# Patient Record
Sex: Female | Born: 1960 | Race: White | Hispanic: No | Smoking: Current every day smoker
Health system: Southern US, Community
[De-identification: ages and names within clinical notes are randomized; demographics above are authoritative.]

## PROBLEM LIST (undated history)

## (undated) HISTORY — PX: ABDOMINAL HYSTERECTOMY: SHX81

## (undated) HISTORY — PX: CHOLECYSTECTOMY: SHX55

---

## 2005-04-13 ENCOUNTER — Encounter: Admission: RE | Admit: 2005-04-13 | Discharge: 2005-04-13 | Payer: Self-pay | Admitting: Sports Medicine

## 2007-05-16 ENCOUNTER — Ambulatory Visit: Payer: Self-pay | Admitting: Nurse Practitioner

## 2007-05-16 DIAGNOSIS — F17200 Nicotine dependence, unspecified, uncomplicated: Secondary | ICD-10-CM

## 2007-05-16 DIAGNOSIS — K219 Gastro-esophageal reflux disease without esophagitis: Secondary | ICD-10-CM

## 2007-05-16 DIAGNOSIS — M79609 Pain in unspecified limb: Secondary | ICD-10-CM | POA: Insufficient documentation

## 2007-05-16 DIAGNOSIS — J309 Allergic rhinitis, unspecified: Secondary | ICD-10-CM | POA: Insufficient documentation

## 2007-05-28 ENCOUNTER — Telehealth (INDEPENDENT_AMBULATORY_CARE_PROVIDER_SITE_OTHER): Payer: Self-pay | Admitting: *Deleted

## 2007-05-30 ENCOUNTER — Ambulatory Visit (HOSPITAL_COMMUNITY): Admission: RE | Admit: 2007-05-30 | Discharge: 2007-05-30 | Payer: Self-pay | Admitting: Internal Medicine

## 2007-05-30 ENCOUNTER — Ambulatory Visit: Payer: Self-pay | Admitting: Nurse Practitioner

## 2007-05-30 DIAGNOSIS — H9319 Tinnitus, unspecified ear: Secondary | ICD-10-CM | POA: Insufficient documentation

## 2007-06-02 ENCOUNTER — Telehealth (INDEPENDENT_AMBULATORY_CARE_PROVIDER_SITE_OTHER): Payer: Self-pay | Admitting: Nurse Practitioner

## 2007-06-03 ENCOUNTER — Ambulatory Visit: Payer: Self-pay | Admitting: *Deleted

## 2007-06-03 ENCOUNTER — Ambulatory Visit: Payer: Self-pay | Admitting: Nurse Practitioner

## 2007-06-07 ENCOUNTER — Emergency Department (HOSPITAL_COMMUNITY): Admission: EM | Admit: 2007-06-07 | Discharge: 2007-06-07 | Payer: Self-pay | Admitting: Emergency Medicine

## 2007-06-19 ENCOUNTER — Ambulatory Visit: Payer: Self-pay | Admitting: Nurse Practitioner

## 2007-06-19 DIAGNOSIS — N951 Menopausal and female climacteric states: Secondary | ICD-10-CM

## 2007-06-19 DIAGNOSIS — K029 Dental caries, unspecified: Secondary | ICD-10-CM | POA: Insufficient documentation

## 2007-06-19 DIAGNOSIS — L03039 Cellulitis of unspecified toe: Secondary | ICD-10-CM

## 2007-06-19 LAB — CONVERTED CEMR LAB: Pap Smear: NORMAL

## 2007-06-20 ENCOUNTER — Encounter (INDEPENDENT_AMBULATORY_CARE_PROVIDER_SITE_OTHER): Payer: Self-pay | Admitting: Nurse Practitioner

## 2007-09-22 ENCOUNTER — Encounter (INDEPENDENT_AMBULATORY_CARE_PROVIDER_SITE_OTHER): Payer: Self-pay | Admitting: Nurse Practitioner

## 2007-09-25 ENCOUNTER — Emergency Department (HOSPITAL_COMMUNITY): Admission: EM | Admit: 2007-09-25 | Discharge: 2007-09-25 | Payer: Self-pay | Admitting: Emergency Medicine

## 2007-10-23 ENCOUNTER — Ambulatory Visit: Payer: Self-pay | Admitting: Nurse Practitioner

## 2007-10-23 DIAGNOSIS — I10 Essential (primary) hypertension: Secondary | ICD-10-CM

## 2007-10-27 ENCOUNTER — Telehealth (INDEPENDENT_AMBULATORY_CARE_PROVIDER_SITE_OTHER): Payer: Self-pay | Admitting: *Deleted

## 2008-03-29 ENCOUNTER — Telehealth (INDEPENDENT_AMBULATORY_CARE_PROVIDER_SITE_OTHER): Payer: Self-pay | Admitting: Nurse Practitioner

## 2008-04-06 ENCOUNTER — Encounter (INDEPENDENT_AMBULATORY_CARE_PROVIDER_SITE_OTHER): Payer: Self-pay | Admitting: *Deleted

## 2008-05-20 ENCOUNTER — Telehealth (INDEPENDENT_AMBULATORY_CARE_PROVIDER_SITE_OTHER): Payer: Self-pay | Admitting: Nurse Practitioner

## 2008-06-02 ENCOUNTER — Ambulatory Visit: Payer: Self-pay | Admitting: Nurse Practitioner

## 2008-06-02 LAB — CONVERTED CEMR LAB
Glucose, Urine, Semiquant: NEGATIVE
KOH Prep: NEGATIVE
Ketones, urine, test strip: NEGATIVE
Nitrite: NEGATIVE
Protein, U semiquant: NEGATIVE
Specific Gravity, Urine: 1.01

## 2008-06-11 ENCOUNTER — Ambulatory Visit: Payer: Self-pay | Admitting: Nurse Practitioner

## 2008-06-11 DIAGNOSIS — N61 Mastitis without abscess: Secondary | ICD-10-CM

## 2008-06-11 LAB — CONVERTED CEMR LAB
Blood in Urine, dipstick: NEGATIVE
KOH Prep: NEGATIVE
Nitrite: NEGATIVE
Protein, U semiquant: NEGATIVE
Urobilinogen, UA: 0.2
WBC Urine, dipstick: NEGATIVE
pH: 7.5

## 2008-09-10 ENCOUNTER — Ambulatory Visit: Payer: Self-pay | Admitting: Nurse Practitioner

## 2008-09-10 ENCOUNTER — Encounter (INDEPENDENT_AMBULATORY_CARE_PROVIDER_SITE_OTHER): Payer: Self-pay | Admitting: Nurse Practitioner

## 2008-09-10 DIAGNOSIS — A599 Trichomoniasis, unspecified: Secondary | ICD-10-CM

## 2008-09-10 DIAGNOSIS — E669 Obesity, unspecified: Secondary | ICD-10-CM

## 2008-09-14 ENCOUNTER — Encounter (INDEPENDENT_AMBULATORY_CARE_PROVIDER_SITE_OTHER): Payer: Self-pay | Admitting: Nurse Practitioner

## 2008-09-14 LAB — CONVERTED CEMR LAB
HCV Ab: NEGATIVE
Hep A Total Ab: NEGATIVE
Hep B S Ab: NEGATIVE
Lipase: 27 units/L (ref 0–75)

## 2008-09-16 ENCOUNTER — Ambulatory Visit (HOSPITAL_COMMUNITY): Admission: RE | Admit: 2008-09-16 | Discharge: 2008-09-16 | Payer: Self-pay | Admitting: Internal Medicine

## 2008-09-20 ENCOUNTER — Telehealth (INDEPENDENT_AMBULATORY_CARE_PROVIDER_SITE_OTHER): Payer: Self-pay | Admitting: Nurse Practitioner

## 2009-01-26 ENCOUNTER — Emergency Department (HOSPITAL_COMMUNITY): Admission: EM | Admit: 2009-01-26 | Discharge: 2009-01-26 | Payer: Self-pay | Admitting: Obstetrics and Gynecology

## 2010-01-07 ENCOUNTER — Emergency Department (HOSPITAL_COMMUNITY): Admission: EM | Admit: 2010-01-07 | Discharge: 2010-01-07 | Payer: Self-pay | Admitting: Emergency Medicine

## 2010-01-09 ENCOUNTER — Emergency Department (HOSPITAL_COMMUNITY): Admission: EM | Admit: 2010-01-09 | Discharge: 2010-01-09 | Payer: Self-pay | Admitting: Emergency Medicine

## 2010-04-14 ENCOUNTER — Emergency Department (HOSPITAL_COMMUNITY): Admission: EM | Admit: 2010-04-14 | Discharge: 2010-04-14 | Payer: Self-pay | Admitting: Emergency Medicine

## 2010-07-06 ENCOUNTER — Emergency Department (HOSPITAL_COMMUNITY)
Admission: EM | Admit: 2010-07-06 | Discharge: 2010-07-06 | Payer: Self-pay | Source: Home / Self Care | Admitting: Emergency Medicine

## 2010-08-20 ENCOUNTER — Encounter: Payer: Self-pay | Admitting: Family Medicine

## 2010-08-20 ENCOUNTER — Encounter: Payer: Self-pay | Admitting: General Surgery

## 2010-08-27 LAB — CONVERTED CEMR LAB
AST: 38 units/L — ABNORMAL HIGH (ref 0–37)
Albumin: 4.3 g/dL (ref 3.5–5.2)
Alkaline Phosphatase: 65 units/L (ref 39–117)
BUN: 12 mg/dL (ref 6–23)
Basophils Absolute: 0 10*3/uL (ref 0.0–0.1)
Basophils Relative: 0 % (ref 0–1)
Basophils Relative: 1 % (ref 0–1)
Bilirubin Urine: NEGATIVE
CO2: 21 meq/L (ref 19–32)
CO2: 23 meq/L (ref 19–32)
Chlamydia, DNA Probe: NEGATIVE
Chlamydia, DNA Probe: NEGATIVE
Chloride: 106 meq/L (ref 96–112)
Chloride: 107 meq/L (ref 96–112)
Cholesterol: 178 mg/dL (ref 0–200)
Cholesterol: 203 mg/dL — ABNORMAL HIGH (ref 0–200)
Creatinine, Ser: 0.72 mg/dL (ref 0.40–1.20)
Creatinine, Ser: 0.76 mg/dL (ref 0.40–1.20)
Eosinophils Relative: 2 % (ref 0–5)
FSH: 2.2 milliintl units/mL
Glucose, Bld: 87 mg/dL (ref 70–99)
Glucose, Bld: 96 mg/dL (ref 70–99)
Glucose, Urine, Semiquant: NEGATIVE
Glucose, Urine, Semiquant: NEGATIVE
HDL: 63 mg/dL (ref >39)
Hemoglobin: 14.4 g/dL (ref 12.0–15.0)
Hemoglobin: 15.4 g/dL — ABNORMAL HIGH (ref 12.0–15.0)
KOH Prep: NEGATIVE
KOH Prep: NEGATIVE
Ketones, urine, test strip: NEGATIVE
LDL Cholesterol: 92 mg/dL (ref 0–99)
Lymphocytes Relative: 36 % (ref 12–46)
Lymphs Abs: 3 10*3/uL (ref 0.7–4.0)
MCHC: 33.5 g/dL (ref 30.0–36.0)
Monocytes Absolute: 0.5 10*3/uL (ref 0.1–1.0)
Monocytes Absolute: 0.5 10*3/uL (ref 0.1–1.0)
Monocytes Relative: 7 % (ref 3–12)
Neutro Abs: 3.5 10*3/uL (ref 1.7–7.7)
Neutrophils Relative %: 49 % (ref 43–77)
Platelets: 255 10*3/uL (ref 150–400)
Potassium: 4.1 meq/L (ref 3.5–5.3)
Protein, U semiquant: NEGATIVE
Protein, U semiquant: NEGATIVE
RBC: 4.53 M/uL (ref 3.87–5.11)
Sodium: 141 meq/L (ref 135–145)
Specific Gravity, Urine: 1.015
Specific Gravity, Urine: >=1.03
TSH: 0.918 microintl units/mL (ref 0.350–4.50)
Total Bilirubin: 0.5 mg/dL (ref 0.3–1.2)
Triglycerides: 103 mg/dL (ref <150)
Urobilinogen, UA: 0.2
VLDL: 21 mg/dL (ref 0–40)
WBC Urine, dipstick: NEGATIVE
pH: 5

## 2010-11-15 ENCOUNTER — Inpatient Hospital Stay (INDEPENDENT_AMBULATORY_CARE_PROVIDER_SITE_OTHER)
Admission: RE | Admit: 2010-11-15 | Discharge: 2010-11-15 | Disposition: A | Discharge status: Home / Self Care | Payer: Self-pay | Source: Ambulatory Visit | Attending: Family Medicine | Admitting: Family Medicine

## 2010-11-15 DIAGNOSIS — R109 Unspecified abdominal pain: Secondary | ICD-10-CM

## 2010-11-15 LAB — POCT I-STAT, CHEM 8
Glucose, Bld: 112 mg/dL — ABNORMAL HIGH (ref 70–99)
Potassium: 4.7 mEq/L (ref 3.5–5.1)
Sodium: 144 mEq/L (ref 135–145)
TCO2: 28 mmol/L (ref 0–100)

## 2010-11-15 LAB — OCCULT BLOOD, POC DEVICE: Fecal Occult Bld: NEGATIVE

## 2010-11-26 ENCOUNTER — Emergency Department (HOSPITAL_COMMUNITY)
Admission: EM | Admit: 2010-11-26 | Discharge: 2010-11-26 | Disposition: A | Discharge status: Home / Self Care | Payer: Self-pay | Attending: Emergency Medicine | Admitting: Emergency Medicine

## 2010-11-26 ENCOUNTER — Inpatient Hospital Stay (INDEPENDENT_AMBULATORY_CARE_PROVIDER_SITE_OTHER)
Admission: RE | Admit: 2010-11-26 | Discharge: 2010-11-26 | Disposition: A | Discharge status: Home / Self Care | Payer: Self-pay | Source: Ambulatory Visit | Attending: Family Medicine | Admitting: Family Medicine

## 2010-11-26 ENCOUNTER — Emergency Department (HOSPITAL_COMMUNITY): Payer: Self-pay

## 2010-11-26 DIAGNOSIS — R1013 Epigastric pain: Secondary | ICD-10-CM

## 2010-11-26 DIAGNOSIS — R112 Nausea with vomiting, unspecified: Secondary | ICD-10-CM | POA: Insufficient documentation

## 2010-11-26 DIAGNOSIS — R109 Unspecified abdominal pain: Secondary | ICD-10-CM | POA: Insufficient documentation

## 2010-11-26 LAB — COMPREHENSIVE METABOLIC PANEL
ALT: 29 U/L (ref 0–35)
AST: 30 U/L (ref 0–37)
Albumin: 4 g/dL (ref 3.5–5.2)
BUN: 16 mg/dL (ref 6–23)
Calcium: 9.5 mg/dL (ref 8.4–10.5)
Creatinine, Ser: 0.85 mg/dL (ref 0.4–1.2)
Total Protein: 7.1 g/dL (ref 6.0–8.3)

## 2010-11-26 LAB — CBC
HCT: 43.6 % (ref 36.0–46.0)
MCH: 33 pg (ref 26.0–34.0)
MCHC: 34.6 g/dL (ref 30.0–36.0)
MCV: 95.2 fL (ref 78.0–100.0)
Platelets: 254 10*3/uL (ref 150–400)
RBC: 4.58 MIL/uL (ref 3.87–5.11)
RDW: 13.5 % (ref 11.5–15.5)
WBC: 8.7 10*3/uL (ref 4.0–10.5)

## 2010-11-26 LAB — AMYLASE: Amylase: 53 U/L (ref 0–105)

## 2011-01-16 ENCOUNTER — Inpatient Hospital Stay (INDEPENDENT_AMBULATORY_CARE_PROVIDER_SITE_OTHER)
Admission: RE | Admit: 2011-01-16 | Discharge: 2011-01-16 | Disposition: A | Discharge status: Home / Self Care | Payer: Self-pay | Source: Ambulatory Visit | Attending: Family Medicine | Admitting: Family Medicine

## 2011-01-16 DIAGNOSIS — J019 Acute sinusitis, unspecified: Secondary | ICD-10-CM

## 2011-04-20 LAB — I-STAT 8, (EC8 V) (CONVERTED LAB)
Bicarbonate: 25 — ABNORMAL HIGH
HCT: 51 — ABNORMAL HIGH
TCO2: 26
pCO2, Ven: 39.1 — ABNORMAL LOW
pH, Ven: 7.413 — ABNORMAL HIGH

## 2011-04-20 LAB — URINALYSIS, ROUTINE W REFLEX MICROSCOPIC
Nitrite: NEGATIVE
Protein, ur: NEGATIVE

## 2011-04-20 LAB — URINE MICROSCOPIC-ADD ON

## 2011-04-20 LAB — PREGNANCY, URINE: Preg Test, Ur: NEGATIVE

## 2011-04-20 LAB — POCT I-STAT CREATININE: Creatinine, Ser: 0.8

## 2011-05-08 LAB — CBC
HCT: 43.9
Hemoglobin: 15
MCHC: 34.3
MCV: 96.2
Platelets: 317
RBC: 4.56
RDW: 13.4
WBC: 14.4 — ABNORMAL HIGH

## 2011-05-08 LAB — I-STAT 8, (EC8 V) (CONVERTED LAB)
Acid-Base Excess: 5 — ABNORMAL HIGH
Chloride: 101
pCO2, Ven: 43.2 — ABNORMAL LOW
pH, Ven: 7.442 — ABNORMAL HIGH

## 2011-05-08 LAB — DIFFERENTIAL
Basophils Absolute: 0.1
Basophils Relative: 0
Eosinophils Absolute: 0.1
Eosinophils Relative: 1
Lymphocytes Relative: 22
Lymphs Abs: 3.2
Monocytes Absolute: 0.9 — ABNORMAL HIGH
Monocytes Relative: 6
Neutro Abs: 10.1 — ABNORMAL HIGH
Neutrophils Relative %: 70

## 2011-05-08 LAB — POCT I-STAT CREATININE
Creatinine, Ser: 0.8
Operator id: 234501

## 2012-03-06 ENCOUNTER — Encounter (HOSPITAL_BASED_OUTPATIENT_CLINIC_OR_DEPARTMENT_OTHER): Payer: Self-pay | Admitting: *Deleted

## 2012-03-06 ENCOUNTER — Emergency Department (HOSPITAL_BASED_OUTPATIENT_CLINIC_OR_DEPARTMENT_OTHER)
Admission: EM | Admit: 2012-03-06 | Discharge: 2012-03-06 | Disposition: A | Discharge status: Home / Self Care | Payer: Self-pay | Attending: Emergency Medicine | Admitting: Emergency Medicine

## 2012-03-06 DIAGNOSIS — K644 Residual hemorrhoidal skin tags: Secondary | ICD-10-CM | POA: Insufficient documentation

## 2012-03-06 DIAGNOSIS — J3489 Other specified disorders of nose and nasal sinuses: Secondary | ICD-10-CM | POA: Insufficient documentation

## 2012-03-06 DIAGNOSIS — J329 Chronic sinusitis, unspecified: Secondary | ICD-10-CM

## 2012-03-06 DIAGNOSIS — K649 Unspecified hemorrhoids: Secondary | ICD-10-CM

## 2012-03-06 DIAGNOSIS — F17200 Nicotine dependence, unspecified, uncomplicated: Secondary | ICD-10-CM | POA: Insufficient documentation

## 2012-03-06 MED ORDER — CHLORPHEN-PSEUDOEPHED-APAP 2-30-500 MG PO TABS: 1.0000 | ORAL_TABLET | ORAL | Status: AC | PRN

## 2012-03-06 MED ORDER — HYDROCORTISONE 2.5 % RE CREA: TOPICAL_CREAM | RECTAL | Status: AC

## 2012-03-06 MED ORDER — OXYMETAZOLINE HCL 0.05 % NA SOLN
1.0000 | Freq: Once | NASAL | Status: AC
  Administered 2012-03-06: 1 via NASAL
  Filled 2012-03-06: qty 15

## 2012-03-06 NOTE — ED Notes (Signed)
Patient reports that she has "sinus pressure" and possible infection.  Patient reports that she is at [facility] and not allowed to take anything OTC.  She also requested that we check her hemorrhoids while she is here.

## 2012-03-06 NOTE — ED Provider Notes (Signed)
History     CSN: [id number]  Arrival date & time 03/06/12  0915   First MD Initiated Contact with Patient 03/06/12 [phone]      Chief Complaint  Patient presents with  . Hemorrhoids  . Facial Pain    (Consider location/radiation/quality/duration/timing/severity/associated sxs/prior treatment) Patient is a 51 y.o. female presenting with URI. The history is provided by the patient.  URI The primary symptoms include ear pain. Primary symptoms do not include fever, cough or nausea. Primary symptoms comment: Sinus congestion Episode onset: 8 days ago she's been unable to take her medication. This is a recurrent problem. The problem has been gradually worsening.  The ear pain began more than 2 days ago. Ear pain is a new problem. The ear pain has been gradually worsening since its onset. Both ears are affected. The pain is moderate.    Symptoms associated with the illness include plugged ear sensation, sinus pressure and congestion. The illness is not associated with facial pain. The following treatments were addressed: Acetaminophen was not tried. A decongestant was not tried.    History reviewed. No pertinent past medical history.  Past Surgical History  Procedure Date  . Abdominal hysterectomy   . Cholecystectomy     History reviewed. No pertinent family history.  History  Substance Use Topics  . Smoking status: Current Everyday Smoker -- 0.5 packs/day  . Smokeless tobacco: Not on file  . Alcohol Use: No    OB History    Grav Para Term Preterm Abortions TAB SAB Ect Mult Living                  Review of Systems  Constitutional: Negative for fever.  HENT: Positive for ear pain, congestion and sinus pressure.   Respiratory: Negative for cough.   Gastrointestinal: Negative for nausea.  Genitourinary:       Rectal pain from hemorrhoids that have gotten worse recently from all the sitting she's doing at rehabilitation  All other systems reviewed and are  negative.    Allergies  Review of patient's allergies indicates no known allergies.  Home Medications  No current outpatient prescriptions on file.  BP 141/82  Pulse 79  Temp 97.9 F (36.6 C) (Oral)  Resp 20  Ht 5\' 9"  (1.753 m)  Wt 260 lb (117.935 kg)  BMI 38.40 kg/m2  SpO2 99%  Physical Exam  Nursing note and vitals reviewed. Constitutional: She is oriented to person, place, and time. She appears well-developed and well-nourished. No distress.  HENT:  Head: Normocephalic and atraumatic.  Right Ear: Ear canal normal. Tympanic membrane is bulging. Tympanic membrane is not injected, not perforated and not erythematous.  Left Ear: Ear canal normal. Tympanic membrane is bulging. Tympanic membrane is not injected, not perforated and not erythematous.  Nose: Mucosal edema present. Right sinus exhibits frontal sinus tenderness. Right sinus exhibits no maxillary sinus tenderness. Left sinus exhibits frontal sinus tenderness. Left sinus exhibits no maxillary sinus tenderness.  Mouth/Throat: Oropharynx is clear and moist.  Eyes: Conjunctivae and EOM are normal. Pupils are equal, round, and reactive to light.  Neck: Normal range of motion. Neck supple.  Cardiovascular: Normal rate, regular rhythm and intact distal pulses.   No murmur heard. Pulmonary/Chest: Effort normal and breath sounds normal. No respiratory distress. She has no wheezes. She has no rales.  Abdominal: Soft. She exhibits no distension. There is no tenderness. There is no rebound and no guarding.  Genitourinary: Rectal exam shows external hemorrhoid.  Musculoskeletal: Normal range of motion.  She exhibits no edema and no tenderness.  Neurological: She is alert and oriented to person, place, and time.  Skin: Skin is warm and dry. No rash noted. No erythema.  Psychiatric: She has a normal mood and affect. Her behavior is normal.    ED Course  Procedures (including critical care time)  Labs Reviewed - No data to  display No results found.   No diagnosis found.    MDM   Patient is here complaining of sinus congestion due to being in a rehabilitation facility and unable to take her typical Tylenol Sinus. For the last 8 days she's had congestion without infectious symptoms. Exam is consistent with sinus congestion. Patient placed on Afrin and with a prescription for Tylenol Sinus. Secondly patient states her hemorrhoids are acting up because she's been sitting for a while classes recently. Denies any bleeding and on exam the thrombosed hemorrhoids. Patient given a prescription for Anusol cream        Gwyneth Sprout, MD 03/06/12 [phone]

## 2012-03-06 NOTE — ED Notes (Signed)
Pt states she has sinus pressure and a problem with her hemmorhoid is in [facility] and not allowed to have any otc meds without a prescription from a physician

## 2012-05-04 ENCOUNTER — Encounter (HOSPITAL_COMMUNITY): Payer: Self-pay | Admitting: Emergency Medicine

## 2012-05-04 ENCOUNTER — Emergency Department (INDEPENDENT_AMBULATORY_CARE_PROVIDER_SITE_OTHER)
Admission: EM | Admit: 2012-05-04 | Discharge: 2012-05-04 | Disposition: A | Discharge status: Home / Self Care | Payer: Self-pay | Source: Home / Self Care | Attending: Emergency Medicine | Admitting: Emergency Medicine

## 2012-05-04 DIAGNOSIS — H8110 Benign paroxysmal vertigo, unspecified ear: Secondary | ICD-10-CM

## 2012-05-04 DIAGNOSIS — H9319 Tinnitus, unspecified ear: Secondary | ICD-10-CM

## 2012-05-04 MED ORDER — MECLIZINE HCL 25 MG PO TABS: 25.0000 mg | ORAL_TABLET | Freq: Three times a day (TID) | ORAL | Status: DC | PRN

## 2012-05-04 MED ORDER — LORATADINE 10 MG PO TABS: 10.0000 mg | ORAL_TABLET | Freq: Every day | ORAL | Status: DC

## 2012-05-04 NOTE — ED Provider Notes (Signed)
Chief Complaint  Patient presents with  . Hypertension    History of Present Illness:  Gwendolyn Cervantes is a 51 year old female who presents today with a four-day history of dizziness, whirling vertigo, sensation of spinning, and nausea. She woke up out of sleep at 4 AM with these symptoms. The symptoms seem to be worse in the morning and worse with movement. She also notes slight decrease in hearing and a buzzing sensation in the middle of her head. She's had no headache, but denies any diplopia or blurred vision. She has no ear pain, nasal congestion, rhinorrhea, sneezing, or itching of the nose, or itchy, watery eyes. No sore throat, adenopathy, or shortness of breath, chest pain, or palpitations. She was concerned that this might be her blood pressure and so she had it checked at the drugstore and found to be 163/107 and 160/93. She has not have a prior history of high blood pressure. She denies paresthesias, numbness, tingling, weakness, or trouble with speech or swallowing. She does feel somewhat ataxic and feels a little staggery when she tries to walk.  Review of Systems:  Other than noted above, the patient denies any of the following symptoms: Systemic:  No fever, chills, fatigue, or weight loss. Eye:  No blurred vision, visual change or diplopia. ENT:  No ear pain, tinnitus, hearing loss, nasal congestion, or rhinorrhea. Cardiac:  No chest pain, dyspnea, palpitations or syncope. Neuro:  No headache, paresthesias, weakness, trouble with speech, coordination or ambulation.  PMFSH:  Past medical history, family history, social history, meds, and allergies were reviewed.  Physical Exam:   Vital signs:  BP 120/78  Pulse 81  Temp 98.2 F (36.8 C) (Oral)  Resp 22  SpO2 100% General:  Alert, oriented times 3, in no distress. Eye:  PERRL, full EOM, no nystagmus. ENT:  TMs and canals normal.  Nasal mucosa normal.  Pharynx clear. Hearing was normal to whispered words, but she could not hear a soft  taking watch in either ear. Neck:  No adenopathy, tenderness, or mass.  Thyroid normal.  No carotid bruit. Lungs:  Breath sounds clear and equal bilaterally.  No wheezes, rales or rhonchi. Heart:  Regular rhythm.  No gallops, murmers, or rubs. Neuro:  Alert and oriented times 3.  Cranial nerves intact.  No pronator drift.  Finger to nose normal.  No focal weakness.  Sensation intact to light touch.  Romberg's sign negative, gait normal.  Able to do tandem gait well.  Dix-Hallpike maneuver was positive with the right ear down.   Assessment:  The primary encounter diagnosis was Benign positional vertigo. A diagnosis of Tinnitus was also pertinent to this visit.  She has a positive Dix Hallpike maneuver, suggesting benign positional vertigo due to canalithiasis. This would not explain her tinnitus and diminished hearing. It's possible that this may just be a coincidence, or she may have Mnire's disease or labyrinthitis.  Plan:   1.  The following meds were prescribed:   New Prescriptions   LORATADINE (CLARITIN) 10 MG TABLET    Take 1 tablet (10 mg total) by mouth daily.   MECLIZINE (ANTIVERT) 25 MG TABLET    Take 1 tablet (25 mg total) by mouth 3 (three) times daily as needed.   2.  The patient was instructed in symptomatic care and handouts were given. She was given Epley exercises to do. 3.  The patient was told to return if becoming worse in any way, if no better in 3 or 4 days, and given  some red flag symptoms that would indicate earlier return.  Follow up:  The patient was told to follow up with Dr. Suszanne Conners if no better in 2 weeks.     Reuben Likes, MD 05/04/12 [phone]

## 2012-05-04 NOTE — ED Notes (Signed)
Pt c/o "airplane noise in her head" that woke her up out of sleep Wednesday morning. Pt states that she has been having a headaches for the past week and [facility] and all readings were high. Pt states that pharmacist informed her to be checked out. Pt has family hx of high blood pressure on both sides of family.

## 2012-06-24 ENCOUNTER — Emergency Department (INDEPENDENT_AMBULATORY_CARE_PROVIDER_SITE_OTHER)
Admission: EM | Admit: 2012-06-24 | Discharge: 2012-06-24 | Disposition: A | Discharge status: Home / Self Care | Payer: Self-pay | Source: Home / Self Care

## 2012-06-24 ENCOUNTER — Encounter (HOSPITAL_COMMUNITY): Payer: Self-pay

## 2012-06-24 DIAGNOSIS — I10 Essential (primary) hypertension: Secondary | ICD-10-CM

## 2012-06-24 DIAGNOSIS — J069 Acute upper respiratory infection, unspecified: Secondary | ICD-10-CM

## 2012-06-24 DIAGNOSIS — J029 Acute pharyngitis, unspecified: Secondary | ICD-10-CM

## 2012-06-24 MED ORDER — PROMETHAZINE HCL 12.5 MG PO TABS: 12.5000 mg | ORAL_TABLET | Freq: Four times a day (QID) | ORAL | Status: DC | PRN

## 2012-06-24 MED ORDER — KETOROLAC TROMETHAMINE 30 MG/ML IJ SOLN
INTRAMUSCULAR | Status: AC
  Filled 2012-06-24: qty 1

## 2012-06-24 MED ORDER — OXYCODONE-ACETAMINOPHEN 5-325 MG PO TABS: 1.0000 | ORAL_TABLET | Freq: Four times a day (QID) | ORAL | Status: DC | PRN

## 2012-06-24 MED ORDER — KETOROLAC TROMETHAMINE 30 MG/ML IJ SOLN
30.0000 mg | Freq: Once | INTRAMUSCULAR | Status: AC
  Administered 2012-06-24: 30 mg via INTRAMUSCULAR

## 2012-06-24 MED ORDER — IBUPROFEN 600 MG PO TABS: 600.0000 mg | ORAL_TABLET | Freq: Three times a day (TID) | ORAL | Status: DC | PRN

## 2012-06-24 MED ORDER — OXYCODONE-ACETAMINOPHEN 5-325 MG PO TABS: 1.0000 | ORAL_TABLET | ORAL | Status: DC | PRN

## 2012-06-24 MED ORDER — AZITHROMYCIN 250 MG PO TABS: ORAL_TABLET | ORAL | Status: DC

## 2012-06-24 NOTE — ED Provider Notes (Signed)
History   CSN: [id number]  Arrival date & time 06/24/12  1100   First MD Initiated Contact with Patient 06/24/12 1101     Chief Complaint  Patient presents with  . Generalized Body Aches    flu like symptoms x2 days   HPI Pt reports generalized body aches, sore throat, feels raw, back pain, aches, and pains.  She is requesting pain medications and asking for Percocet in particular.  The patient reports that this helped her in the past.  The patient reports no known sick contacts.  The patient reports that her symptoms have been present for approximately 2 days.  The patient reports that her temperature has not been higher than 99.  The patient reports a nonproductive cough.  The patient reports no abdominal pain diarrhea.  The patient reports no constipation.  The patient reports no rash over the skin.  The patient denies severe headache.  The patient does report that she's had generalized malaise.  History reviewed. No pertinent past medical history.  Past Surgical History  Procedure Date  . Abdominal hysterectomy   . Cholecystectomy     Family History  Problem Relation Age of Onset  . Hypertension Other     History  Substance Use Topics  . Smoking status: Current Every Day Smoker -- 0.5 packs/day  . Smokeless tobacco: Not on file  . Alcohol Use: No    OB History    Grav Para Term Preterm Abortions TAB SAB Ect Mult Living                  Review of Systems  Constitutional: Positive for fever, chills, activity change, appetite change and fatigue. Negative for unexpected weight change.  HENT: Positive for ear pain, congestion, rhinorrhea, sneezing and neck pain. Negative for hearing loss, nosebleeds, neck stiffness, postnasal drip, tinnitus and ear discharge.   Eyes: Negative.   Respiratory: Positive for cough. Negative for choking, chest tightness, shortness of breath and wheezing.   Cardiovascular: Negative for chest pain, palpitations and leg swelling.    Gastrointestinal: Negative for abdominal distention.  Genitourinary: Negative.   Musculoskeletal: Positive for arthralgias.  Neurological: Positive for weakness.  Hematological: Negative for adenopathy. Does not bruise/bleed easily.  Psychiatric/Behavioral: Negative.     Allergies  Review of patient's allergies indicates no known allergies.  Home Medications   Current Outpatient Rx  Name  Route  Sig  Dispense  Refill  . LORATADINE 10 MG PO TABS   Oral   Take 1 tablet (10 mg total) by mouth daily.   15 tablet   3   . MECLIZINE HCL 25 MG PO TABS   Oral   Take 1 tablet (25 mg total) by mouth 3 (three) times daily as needed.   30 tablet   0     BP 121/68  Pulse 82  Temp 99.4 F (37.4 C) (Oral)  Resp 22  SpO2 100%  Physical Exam  Constitutional: She is oriented to person, place, and time. She appears well-developed and well-nourished. No distress.  HENT:  Head: Normocephalic and atraumatic.       Dry mucous membranes, posterior pharynx mildly erythematous, no exudates seen   Cardiovascular: Normal rate, regular rhythm and normal heart sounds.   Pulmonary/Chest: Effort normal. No respiratory distress. She has no wheezes. She has rales.       Rare posterior basilar rales  Abdominal: Soft. Bowel sounds are normal. She exhibits no distension and no mass. There is no tenderness. There is no  rebound and no guarding.  Musculoskeletal: Normal range of motion. She exhibits no edema and no tenderness.  Neurological: She is alert and oriented to person, place, and time.  Skin: Skin is warm and dry.  Psychiatric: She has a normal mood and affect. Judgment and thought content normal.    ED Course  Procedures (including critical care time)  Labs Reviewed - No data to display No results found.  No diagnosis found.   MDM  URI, likely viral syndrome Generalized body aches and pain Chronic Pain  Toradol 30 mg given in the office today Ibuprofen 600 mg every 8 hours prn  body aches Percocet 5mg   #15 tabs, no refills Azithromycin Z pak - take as directed Phenergan 12.5 mg prn   The patient was given clear instructions to go to ER or return to medical center if symptoms don't improve, worsen or new problems develop.  The patient verbalized understanding.  The patient was told to call to get lab results if they haven't heard anything in the next week.            Cleora Fleet, MD 06/24/12 1446

## 2012-06-24 NOTE — ED Notes (Signed)
Patient states body aches from head to toe.  Symptoms started 2 days ago and has gotten progressively worse. Patient has verbalized she is taking OTC alkaseltzer plus and motrin with minimum relief

## 2012-09-01 ENCOUNTER — Encounter (HOSPITAL_COMMUNITY): Payer: Self-pay | Admitting: *Deleted

## 2012-09-01 ENCOUNTER — Emergency Department (HOSPITAL_COMMUNITY)
Admission: EM | Admit: 2012-09-01 | Discharge: 2012-09-01 | Disposition: A | Discharge status: Home / Self Care | Payer: Self-pay | Attending: Emergency Medicine | Admitting: Emergency Medicine

## 2012-09-01 DIAGNOSIS — Z79899 Other long term (current) drug therapy: Secondary | ICD-10-CM | POA: Insufficient documentation

## 2012-09-01 DIAGNOSIS — R05 Cough: Secondary | ICD-10-CM | POA: Insufficient documentation

## 2012-09-01 DIAGNOSIS — F17200 Nicotine dependence, unspecified, uncomplicated: Secondary | ICD-10-CM | POA: Insufficient documentation

## 2012-09-01 DIAGNOSIS — J069 Acute upper respiratory infection, unspecified: Secondary | ICD-10-CM | POA: Insufficient documentation

## 2012-09-01 DIAGNOSIS — R059 Cough, unspecified: Secondary | ICD-10-CM | POA: Insufficient documentation

## 2012-09-01 DIAGNOSIS — H6690 Otitis media, unspecified, unspecified ear: Secondary | ICD-10-CM | POA: Insufficient documentation

## 2012-09-01 MED ORDER — OXYCODONE-ACETAMINOPHEN 5-325 MG PO TABS: 1.0000 | ORAL_TABLET | Freq: Four times a day (QID) | ORAL | Status: DC | PRN

## 2012-09-01 MED ORDER — AZITHROMYCIN 250 MG PO TABS: 250.0000 mg | ORAL_TABLET | Freq: Every day | ORAL | Status: DC

## 2012-09-01 MED ORDER — HYDROMORPHONE HCL PF 2 MG/ML IJ SOLN
2.0000 mg | Freq: Once | INTRAMUSCULAR | Status: AC
  Administered 2012-09-01: 2 mg via INTRAMUSCULAR
  Filled 2012-09-01: qty 1

## 2012-09-01 NOTE — ED Notes (Signed)
Pt c/o sinus pressure (L frontal and facial) x 1 week that has developed into cough and L earache.  Pt has been trying otc meds with no relief.  Last took 800 mg ibuprofen with no relief.

## 2012-09-01 NOTE — ED Provider Notes (Signed)
History   This chart was scribed for Shelda Jakes, MD, by Frederik Pear, ER scribe. The patient was seen in room [facility] and the patient's care was started at 2117.    CSN: [id number]  Arrival date & time 09/01/12  1910   First MD Initiated Contact with Patient 09/01/12 2117      Chief Complaint  Patient presents with  . URI    (Consider location/radiation/quality/duration/timing/severity/associated sxs/prior treatment) HPI  Gwendolyn Cervantes is a 52 y.o. female who presents to the Emergency Department complaining of sudden onset, constant, gradually worsening bilateral ear pain that is worse on the right with associated nonproductive coughing, clear rhinorrhea, congestion, SOB, neck pain, and back pain that began 1 week ago. She denies any fever, visual changes, chest pain, abdominal pain, nausea, emesis, diarrhea, dysuria, rash, or hemophilia. She reports that her hearing sounds muffled due to the pain. She states that she has been treating the symptoms with 10 mg antihistamine, OTC multi-symptom cold medication, and 800 mg of ibuprofen, which her last dose was at 1800 and provided no relief.  History reviewed. No pertinent past medical history.  Past Surgical History  Procedure Date  . Abdominal hysterectomy   . Cholecystectomy     Family History  Problem Relation Age of Onset  . Hypertension Other     History  Substance Use Topics  . Smoking status: Current Every Day Smoker -- 0.5 packs/day  . Smokeless tobacco: Not on file  . Alcohol Use: No    OB History    Grav Para Term Preterm Abortions TAB SAB Ect Mult Living                  Review of Systems  Constitutional: Negative for fever.  HENT: Positive for hearing loss, ear pain, congestion, rhinorrhea and neck pain.   Eyes: Negative for visual disturbance.  Respiratory: Positive for cough and shortness of breath.   Cardiovascular: Negative for chest pain.  Gastrointestinal: Negative for nausea, vomiting  and diarrhea.  Genitourinary: Negative for dysuria.  Musculoskeletal: Positive for back pain.  Skin: Negative for rash.  Hematological: Does not bruise/bleed easily.  All other systems reviewed and are negative.    Allergies  Review of patient's allergies indicates no known allergies.  Home Medications   Current Outpatient Rx  Name  Route  Sig  Dispense  Refill  . ACETAMINOPHEN 500 MG PO TABS   Oral   Take 500 mg by mouth every 6 (six) hours as needed. For pain         . DEXTROMETHORPHAN-GUAIFENESIN 10-100 MG/5ML PO LIQD   Oral   Take 5 mLs by mouth every 4 (four) hours as needed. For congestion         . LORATADINE 10 MG PO TABS   Oral   Take 1 tablet (10 mg total) by mouth daily.   15 tablet   3   . PSEUDOEPHEDRINE HCL 60 MG PO TABS   Oral   Take 60 mg by mouth every 4 (four) hours as needed. For congestion         . AZITHROMYCIN 250 MG PO TABS   Oral   Take 1 tablet (250 mg total) by mouth daily. Take first 2 tablets together, then 1 every day until finished.   6 tablet   0   . OXYCODONE-ACETAMINOPHEN 5-325 MG PO TABS   Oral   Take 1-2 tablets by mouth every 6 (six) hours as needed for pain.   20  tablet   0     BP 146/101  Pulse 95  Temp 98 F (36.7 C) (Oral)  Resp 18  SpO2 96%  Physical Exam  Nursing note and vitals reviewed. Constitutional: She is oriented to person, place, and time.  HENT:  Head: Normocephalic and atraumatic.  Right Ear: Ear canal normal. Tympanic membrane is erythematous.  Left Ear: Ear canal normal. Tympanic membrane is injected and bulging.  Mouth/Throat: Oropharynx is clear and moist and mucous membranes are normal.       Good light reflex on the right TM. Bilateral erythema that is worse on the right TM.   Cardiovascular: Normal rate, regular rhythm and normal heart sounds.   No murmur heard. Pulmonary/Chest: Effort normal and breath sounds normal. No respiratory distress.  Abdominal: Soft. Bowel sounds are normal.  There is no tenderness.  Neurological: She is alert and oriented to person, place, and time. No cranial nerve deficit. She exhibits normal muscle tone. Coordination normal.    ED Course  Procedures (including critical care time)  DIAGNOSTIC STUDIES: Oxygen Saturation is 100% on room air, adequate by my interpretation.    COORDINATION OF CARE:  21:55- Discussed planned course of treatment with the patient, including pain medication, who is agreeable at this time.  22:15- Medication Orders- hydromorphone (Dilaudid) injection 2 mg- once.  Labs Reviewed - No data to display No results found.   1. Otitis media   2. Upper respiratory infection       MDM   Patient with complaint of bilateral her pain upper respiratory infections. Left ear greater than right. Has muffled hearing suggestive of fluid behind eardrums. Left eardrum is bulging but not particularly red. The right tympanic membrane has erythema in the upper quadrant area. Patient's afebrile no acute distress nontoxic. Will treat with pain medication and Zithromax Z-Pak for the ear infections.    I personally performed the services described in this documentation, which was scribed in my presence. The recorded information has been reviewed and is accurate.         Shelda Jakes, MD 09/01/12 2225

## 2013-07-31 ENCOUNTER — Encounter (HOSPITAL_COMMUNITY): Payer: Self-pay | Admitting: Emergency Medicine

## 2013-07-31 DIAGNOSIS — R109 Unspecified abdominal pain: Secondary | ICD-10-CM | POA: Insufficient documentation

## 2013-07-31 DIAGNOSIS — F17200 Nicotine dependence, unspecified, uncomplicated: Secondary | ICD-10-CM | POA: Insufficient documentation

## 2013-07-31 DIAGNOSIS — M545 Low back pain, unspecified: Secondary | ICD-10-CM | POA: Insufficient documentation

## 2013-07-31 LAB — COMPREHENSIVE METABOLIC PANEL
ALBUMIN: 4.3 g/dL (ref 3.5–5.2)
ALT: 20 U/L (ref 0–35)
AST: 25 U/L (ref 0–37)
Alkaline Phosphatase: 67 U/L (ref 39–117)
BILIRUBIN TOTAL: 0.3 mg/dL (ref 0.3–1.2)
BUN: 13 mg/dL (ref 6–23)
CALCIUM: 9.5 mg/dL (ref 8.4–10.5)
CHLORIDE: 107 meq/L (ref 96–112)
CO2: 24 mEq/L (ref 19–32)
CREATININE: 0.87 mg/dL (ref 0.50–1.10)
GFR calc Af Amer: 86 mL/min — ABNORMAL LOW (ref >90)
GFR calc non Af Amer: 74 mL/min — ABNORMAL LOW (ref >90)
Glucose, Bld: 95 mg/dL (ref 70–99)
Potassium: 4.9 mEq/L (ref 3.7–5.3)
Sodium: 143 mEq/L (ref 137–147)
Total Protein: 7.8 g/dL (ref 6.0–8.3)

## 2013-07-31 LAB — CBC WITH DIFFERENTIAL/PLATELET
BASOS ABS: 0.1 10*3/uL (ref 0.0–0.1)
BASOS PCT: 1 % (ref 0–1)
EOS ABS: 0.3 10*3/uL (ref 0.0–0.7)
EOS PCT: 3 % (ref 0–5)
HEMATOCRIT: 43.9 % (ref 36.0–46.0)
HEMOGLOBIN: 15.3 g/dL — AB (ref 12.0–15.0)
Lymphocytes Relative: 39 % (ref 12–46)
Lymphs Abs: 3.2 10*3/uL (ref 0.7–4.0)
MCH: 34 pg (ref 26.0–34.0)
MCHC: 34.9 g/dL (ref 30.0–36.0)
MCV: 97.6 fL (ref 78.0–100.0)
MONO ABS: 0.5 10*3/uL (ref 0.1–1.0)
MONOS PCT: 6 % (ref 3–12)
NEUTROS ABS: 4.2 10*3/uL (ref 1.7–7.7)
Neutrophils Relative %: 51 % (ref 43–77)
Platelets: 228 10*3/uL (ref 150–400)
RBC: 4.5 MIL/uL (ref 3.87–5.11)
RDW: 13.6 % (ref 11.5–15.5)
WBC: 8.2 10*3/uL (ref 4.0–10.5)

## 2013-07-31 LAB — URINE MICROSCOPIC-ADD ON

## 2013-07-31 LAB — URINALYSIS, ROUTINE W REFLEX MICROSCOPIC
BILIRUBIN URINE: NEGATIVE
GLUCOSE, UA: NEGATIVE mg/dL
KETONES UR: NEGATIVE mg/dL
LEUKOCYTES UA: NEGATIVE
Nitrite: NEGATIVE
PH: 5 (ref 5.0–8.0)
PROTEIN: NEGATIVE mg/dL
Specific Gravity, Urine: 1.022 (ref 1.005–1.030)
Urobilinogen, UA: 0.2 mg/dL (ref 0.0–1.0)

## 2013-07-31 NOTE — ED Notes (Signed)
Pt. reports left flank pain / left lower back pain for 4 days , denies hematuria or injury. Pain worse with movement.

## 2013-07-31 NOTE — ED Notes (Signed)
Nurse explained delay , wait time and process to pt. Pillow given per pt.'s request .

## 2013-08-01 ENCOUNTER — Emergency Department (HOSPITAL_COMMUNITY)
Admission: EM | Admit: 2013-08-01 | Discharge: 2013-08-01 | Discharge status: Against Medical Advice | Payer: Self-pay | Attending: Emergency Medicine | Admitting: Emergency Medicine

## 2013-08-01 NOTE — ED Notes (Signed)
Unable to locate pt  

## 2013-08-04 ENCOUNTER — Encounter (HOSPITAL_COMMUNITY): Payer: Self-pay | Admitting: Emergency Medicine

## 2013-08-04 ENCOUNTER — Emergency Department (HOSPITAL_COMMUNITY)
Admission: EM | Admit: 2013-08-04 | Discharge: 2013-08-04 | Disposition: A | Discharge status: Home / Self Care | Payer: Self-pay | Attending: Emergency Medicine | Admitting: Emergency Medicine

## 2013-08-04 DIAGNOSIS — M545 Low back pain, unspecified: Secondary | ICD-10-CM | POA: Insufficient documentation

## 2013-08-04 DIAGNOSIS — R6883 Chills (without fever): Secondary | ICD-10-CM | POA: Insufficient documentation

## 2013-08-04 DIAGNOSIS — R209 Unspecified disturbances of skin sensation: Secondary | ICD-10-CM | POA: Insufficient documentation

## 2013-08-04 DIAGNOSIS — F17200 Nicotine dependence, unspecified, uncomplicated: Secondary | ICD-10-CM | POA: Insufficient documentation

## 2013-08-04 DIAGNOSIS — M79609 Pain in unspecified limb: Secondary | ICD-10-CM | POA: Insufficient documentation

## 2013-08-04 MED ORDER — NAPROXEN 500 MG PO TABS: 500.0000 mg | ORAL_TABLET | Freq: Two times a day (BID) | ORAL | Status: DC

## 2013-08-04 MED ORDER — METHOCARBAMOL 500 MG PO TABS: 500.0000 mg | ORAL_TABLET | Freq: Two times a day (BID) | ORAL | Status: DC

## 2013-08-04 MED ORDER — OXYCODONE-ACETAMINOPHEN 5-325 MG PO TABS
1.0000 | ORAL_TABLET | Freq: Once | ORAL | Status: AC
  Administered 2013-08-04: 1 via ORAL
  Filled 2013-08-04: qty 1

## 2013-08-04 MED ORDER — OXYCODONE-ACETAMINOPHEN 5-325 MG PO TABS: 1.0000 | ORAL_TABLET | Freq: Four times a day (QID) | ORAL | Status: DC | PRN

## 2013-08-04 NOTE — Discharge Instructions (Signed)
Take pain medication and muscle relaxer as needed.  Do not drive or operate heavy machinery for four hours after taking medication.

## 2013-08-04 NOTE — ED Notes (Signed)
Patient states security had parked her car.   She doesn't have keys, name of officer and doesn't know where car is parked.  Security has been called.

## 2013-08-04 NOTE — ED Notes (Addendum)
Pt reports left side lower back pain, radiates into upper back and down left leg. Was here on 1/3 for same but pt left ama. Denies urinary symptoms.

## 2013-08-04 NOTE — ED Provider Notes (Signed)
Medical screening examination/treatment/procedure(s) were performed by non-physician practitioner and as supervising physician I was immediately available for consultation/collaboration.  Gillie Crisci L Doreatha Offer, MD 08/04/13 1721 

## 2013-08-04 NOTE — ED Notes (Signed)
Gwendolyn Cervantes is helping with orange card.

## 2013-08-04 NOTE — ED Provider Notes (Signed)
CSN: [id number]     Arrival date & time 08/04/13  1207 History  This chart was scribed for Santiago Glad, PA-C, working with Flint Melter, MD by Blanchard Kelch, ED Scribe. This patient was seen in room TR11C/TR11C and the patient's care was started at 2:15 PM.    Chief Complaint  Patient presents with  . Back Pain    Patient is a 53 y.o. female presenting with back pain. The history is provided by the patient. No language interpreter was used.  Back Pain Associated symptoms: numbness   Associated symptoms: no fever     HPI Comments: Gwendolyn Cervantes is a 53 y.o. female who presents to the Emergency Department complaining of waxing and waning left sided lower back pain that began about a week ago but severely worsened today. The pain is worsened by getting up from rest. It radiates down her left thigh and throughout the entire lower back when she stands. She reports intermittent numbness and tingling in her left leg. The pain has woken her up from her sleep.  Pain worse with ambulation. She states she was seen in the ED three days ago but left without being seen after having to wait for three hours. She denies any acute injury, heavy lifting or bending. She denies any prior similar pain. She has been taking Motrin at home with mild relief. She was given a Percocet in triage with significant relief of the pain. She had one episode of chills three nights ago that has since subsided after taking an Catering manager. No fever.  She denies fever or bowel or bladder incontinence.   History reviewed. No pertinent past medical history. Past Surgical History  Procedure Laterality Date  . Abdominal hysterectomy    . Cholecystectomy     Family History  Problem Relation Age of Onset  . Hypertension Other    History  Substance Use Topics  . Smoking status: Current Every Day Smoker -- 0.50 packs/day  . Smokeless tobacco: Not on file  . Alcohol Use: No   OB History   Grav Para Term Preterm  Abortions TAB SAB Ect Mult Living                 Review of Systems  Constitutional: Negative for fever and chills.  Musculoskeletal: Positive for back pain.  Neurological: Positive for numbness.  All other systems reviewed and are negative.    Allergies  Review of patient's allergies indicates no known allergies.  Home Medications   Current Outpatient Rx  Name  Route  Sig  Dispense  Refill  . acetaminophen (TYLENOL) 500 MG tablet   Oral   Take 1,500 mg by mouth every 6 (six) hours as needed (pain).          Marland Kitchen ibuprofen (ADVIL,MOTRIN) 200 MG tablet   Oral   Take 200-800 mg by mouth every 6 (six) hours as needed for moderate pain.         . methocarbamol (ROBAXIN) 500 MG tablet   Oral   Take 1 tablet (500 mg total) by mouth 2 (two) times daily.   20 tablet   0   . naproxen (NAPROSYN) 500 MG tablet   Oral   Take 1 tablet (500 mg total) by mouth 2 (two) times daily.   30 tablet   0   . oxyCODONE-acetaminophen (PERCOCET/ROXICET) 5-325 MG per tablet   Oral   Take 1 tablet by mouth every 6 (six) hours as needed for severe pain.  15 tablet   0    Triage Vitals: BP 136/84  Pulse 73  Temp(Src) 97.6 F (36.4 C) (Oral)  Resp 16  SpO2 98%  Physical Exam  Nursing note and vitals reviewed. Constitutional: She is oriented to person, place, and time. She appears well-developed and well-nourished. No distress.  HENT:  Head: Normocephalic and atraumatic.  Eyes: EOM are normal.  Neck: Neck supple. No tracheal deviation present.  Cardiovascular: Normal rate, regular rhythm and normal heart sounds.   Pulses:      Dorsalis pedis pulses are 2+ on the right side, and 2+ on the left side.  Pulmonary/Chest: Effort normal and breath sounds normal. No respiratory distress.  Musculoskeletal: Normal range of motion.  Neurological: She is alert and oriented to person, place, and time. Gait normal.  Reflex Scores:      Patellar reflexes are 2+ on the right side and 2+ on the  left side. Normal gait. Muscle strength normal. Sensation of left foot intact.   Skin: Skin is warm and dry. No rash noted. No erythema.  Psychiatric: She has a normal mood and affect. Her behavior is normal.    ED Course  Procedures (including critical care time)  DIAGNOSTIC STUDIES: Oxygen Saturation is 98% on room air, normal by my interpretation.    COORDINATION OF CARE: 2:23 PM -Clinical suspicion of sciatica. Will order pain medication and muscle relaxer. Patient verbalizes understanding and agrees with treatment plan.    Labs Review Labs Reviewed - No data to display Imaging Review No results found.  EKG Interpretation   None       MDM   1. Lower back pain    Patient with back pain.  No neurological deficits and normal neuro exam.  Patient can walk but states is painful.  No loss of bowel or bladder control.  No concern for cauda equina.  No fever, night sweats, weight loss, h/o cancer, IVDU.  RICE protocol and pain medicine indicated and discussed with patient. Patient stable for discharge.  Return precautions given.  I personally performed the services described in this documentation, which was scribed in my presence. The recorded information has been reviewed and is accurate.    Santiago GladHeather Ares Cardozo, PA-C 08/04/13 1533

## 2013-08-04 NOTE — Discharge Planning (Signed)
[facility] Duty[facility] Community Liaison ext [phone]  [facility] at 3:00pm to establish primary care. Patient will also being obtaining the orange card during this visit. Patient was given the application with my contact information for any future questions or concerns.

## 2013-08-07 ENCOUNTER — Ambulatory Visit: Payer: Self-pay | Attending: Internal Medicine | Admitting: Internal Medicine

## 2013-08-07 ENCOUNTER — Encounter: Payer: Self-pay | Admitting: Internal Medicine

## 2013-08-07 VITALS — BP 116/75 | HR 64 | Temp 98.3°F | Resp 16

## 2013-08-07 DIAGNOSIS — S339XXA Sprain of unspecified parts of lumbar spine and pelvis, initial encounter: Secondary | ICD-10-CM | POA: Insufficient documentation

## 2013-08-07 DIAGNOSIS — M549 Dorsalgia, unspecified: Secondary | ICD-10-CM

## 2013-08-07 DIAGNOSIS — X58XXXA Exposure to other specified factors, initial encounter: Secondary | ICD-10-CM | POA: Insufficient documentation

## 2013-08-07 DIAGNOSIS — S335XXA Sprain of ligaments of lumbar spine, initial encounter: Principal | ICD-10-CM

## 2013-08-07 MED ORDER — KETOROLAC TROMETHAMINE 30 MG/ML IJ SOLN: 30.0000 mg | Freq: Once | INTRAMUSCULAR | Status: DC

## 2013-08-07 MED ORDER — PREDNISONE 20 MG PO TABS: 40.0000 mg | ORAL_TABLET | Freq: Every day | ORAL | Status: DC

## 2013-08-07 MED ORDER — PREDNISONE 20 MG PO TABS: 40.0000 mg | ORAL_TABLET | Freq: Every day | ORAL | Status: AC

## 2013-08-07 MED ORDER — KETOROLAC TROMETHAMINE 30 MG/ML IM SOLN: 30.0000 mg | Freq: Once | INTRAMUSCULAR | Status: DC

## 2013-08-07 NOTE — Progress Notes (Signed)
Patient ID: Gwendolyn EmmsCharlene Theresa Cervantes, female   DOB: [birth year], 53 y.o.   MRN: [id number]   CC:  HPI:  53 year old, presents to clinic for a followup of her ED visit on 1/6.waxing and waning left sided lower back pain that began about a week ago but severely worsened today. The pain is worsened by getting up from rest. It radiates down her left thigh and throughout the entire lower back when she stands. She reports intermittent numbness and tingling in her left leg. The pain has woken her up from her sleep. Pain worse with ambulation. She states she was seen in the ED three days ago but left without being seen after having to wait for three hours. She denies any acute injury, heavy lifting or bending. She denies any prior similar pain. She has been taking Motrin at home with mild relief. She was given a Percocet in triage with significant relief of the pain.   No neurological deficits and normal neuro exam. Patient can walk but states is painful. No loss of bowel or bladder control. No concern for cauda equina. No fever, night sweats, weight loss, h/o cancer, IVDU  She was prescribed Robaxin, Naprosyn, Percocet in the ED. She doubled up today in pain and is requesting something for pain in the clinic  No Known Allergies History reviewed. No pertinent past medical history. Current Outpatient Prescriptions on File Prior to Visit  Medication Sig Dispense Refill  . methocarbamol (ROBAXIN) 500 MG tablet Take 1 tablet (500 mg total) by mouth 2 (two) times daily.  20 tablet  0  . naproxen (NAPROSYN) 500 MG tablet Take 1 tablet (500 mg total) by mouth 2 (two) times daily.  30 tablet  0  . oxyCODONE-acetaminophen (PERCOCET/ROXICET) 5-325 MG per tablet Take 1 tablet by mouth every 6 (six) hours as needed for severe pain.  15 tablet  0  . acetaminophen (TYLENOL) 500 MG tablet Take 1,500 mg by mouth every 6 (six) hours as needed (pain).       Marland Kitchen. ibuprofen (ADVIL,MOTRIN) 200 MG tablet Take 200-800 mg by mouth  every 6 (six) hours as needed for moderate pain.       No current facility-administered medications on file prior to visit.   Family History  Problem Relation Age of Onset  . Hypertension Other    History   Social History  . Marital Status: Divorced    Spouse Name: N/A    Number of Children: N/A  . Years of Education: N/A   Occupational History  . Not on file.   Social History Main Topics  . Smoking status: Current Every Day Smoker -- 0.50 packs/day  . Smokeless tobacco: Not on file  . Alcohol Use: No  . Drug Use: No     Comment: last reported use 8 days ago when entering [facility]  . Sexual Activity:    Other Topics Concern  . Not on file   Social History Narrative  . No narrative on file    Review of Systems  Constitutional: Negative for fever, chills, diaphoresis, activity change, appetite change and fatigue.  HENT: Negative for ear pain, nosebleeds, congestion, facial swelling, rhinorrhea, neck pain, neck stiffness and ear discharge.   Eyes: Negative for pain, discharge, redness, itching and visual disturbance.  Respiratory: Negative for cough, choking, chest tightness, shortness of breath, wheezing and stridor.   Cardiovascular: Negative for chest pain, palpitations and leg swelling.  Gastrointestinal: Negative for abdominal distention.  Genitourinary: Negative for dysuria, urgency, frequency, hematuria, flank  pain, decreased urine volume, difficulty urinating and dyspareunia.  Musculoskeletal: Negative for back pain, joint swelling, arthralgias and gait problem.  Neurological: Negative for dizziness, tremors, seizures, syncope, facial asymmetry, speech difficulty, weakness, light-headedness, numbness and headaches.  Hematological: Negative for adenopathy. Does not bruise/bleed easily.  Psychiatric/Behavioral: Negative for hallucinations, behavioral problems, confusion, dysphoric mood, decreased concentration and agitation.    Objective:   Filed Vitals:    08/07/13 1158  BP: 116/75  Pulse: 64  Temp: 98.3 F (36.8 C)  Resp: 16    Physical Exam  Constitutional: Appears well-developed and well-nourished. No distress.  HENT: Normocephalic. External right and left ear normal. Oropharynx is clear and moist.  Eyes: Conjunctivae and EOM are normal. PERRLA, no scleral icterus.  Neck: Normal ROM. Neck supple. No JVD. No tracheal deviation. No thyromegaly.  CVS: RRR, S1/S2 +, no murmurs, no gallops, no carotid bruit.  Pulmonary: Effort and breath sounds normal, no stridor, rhonchi, wheezes, rales.  Abdominal: Soft. BS +,  no distension, tenderness, rebound or guarding.  Musculoskeletal: Normal range of motion. No edema and no tenderness.  Lymphadenopathy: No lymphadenopathy noted, cervical, inguinal. Neuro: Alert. Normal reflexes, muscle tone coordination. No cranial nerve deficit. Skin: Skin is warm and dry. No rash noted. Not diaphoretic. No erythema. No pallor.  Psychiatric: Normal mood and affect. Behavior, judgment, thought content normal.   Lab Results  Component Value Date   WBC 8.2 07/31/2013   HGB 15.3* 07/31/2013   HCT 43.9 07/31/2013   MCV 97.6 07/31/2013   PLT 228 07/31/2013   Lab Results  Component Value Date   CREATININE 0.87 07/31/2013   BUN 13 07/31/2013   NA 143 07/31/2013   K 4.9 07/31/2013   CL 107 07/31/2013   CO2 24 07/31/2013    No results found for this basename: HGBA1C   Lipid Panel     Component Value Date/Time   CHOL 203* 09/10/2008 0000   TRIG 103 09/10/2008 0000   HDL 68 09/10/2008 0000   CHOLHDL 3.0 Ratio 09/10/2008 0000   VLDL 21 09/10/2008 0000   LDLCALC 114* 09/10/2008 0000       Assessment and plan:   Patient Active Problem List   Diagnosis Date Noted  . 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 OBESITY 09/10/2008  . MASTITIS 06/11/2008  . HYPERTENSION, BENIGN ESSENTIAL 10/23/2007  . DENTAL CARIES 06/19/2007  . CLIMACTERIC STATE, FEMALE 06/19/2007  . ONYCHIA AND PARONYCHIA OF TOE 06/19/2007  . TINNITUS, RIGHT 05/30/2007   . TOBACCO ABUSE 05/16/2007  . ALLERGIC RHINITIS 05/16/2007  . GERD 05/16/2007  . HEEL PAIN, RIGHT 05/16/2007       Lumbosacral sprain No imaging studies were done We'll order MRI of the lumbar and sacrum Prescribed prednisone 40 mg for 7 days Have the patient followup in 2 weeks after the MRI      The patient was given clear instructions to go to ER or return to medical center if symptoms don't improve, worsen or new problems develop. The patient verbalized understanding. The patient was told to call to get any lab results if not heard anything in the next week.

## 2013-08-07 NOTE — Progress Notes (Signed)
HFU pinched nerve.

## 2013-08-20 ENCOUNTER — Ambulatory Visit (HOSPITAL_COMMUNITY): Payer: Self-pay

## 2013-08-20 ENCOUNTER — Ambulatory Visit (HOSPITAL_COMMUNITY): Admission: RE | Admit: 2013-08-20 | Payer: Self-pay | Source: Ambulatory Visit

## 2013-08-21 ENCOUNTER — Ambulatory Visit: Payer: Self-pay | Attending: Internal Medicine | Admitting: Internal Medicine

## 2013-08-21 ENCOUNTER — Encounter: Payer: Self-pay | Admitting: Internal Medicine

## 2013-08-21 VITALS — BP 115/74 | HR 73 | Temp 97.7°F | Resp 16 | Ht 69.0 in | Wt 246.0 lb

## 2013-08-21 DIAGNOSIS — M549 Dorsalgia, unspecified: Secondary | ICD-10-CM | POA: Insufficient documentation

## 2013-08-21 MED ORDER — NAPROXEN 500 MG PO TABS: 500.0000 mg | ORAL_TABLET | Freq: Two times a day (BID) | ORAL | Status: DC

## 2013-08-21 MED ORDER — ACETAMINOPHEN-CODEINE #3 300-30 MG PO TABS: 1.0000 | ORAL_TABLET | Freq: Four times a day (QID) | ORAL | Status: DC | PRN

## 2013-08-21 MED ORDER — IBUPROFEN 200 MG PO TABS: 200.0000 mg | ORAL_TABLET | Freq: Four times a day (QID) | ORAL | Status: DC | PRN

## 2013-08-21 MED ORDER — METHOCARBAMOL 500 MG PO TABS: 500.0000 mg | ORAL_TABLET | Freq: Four times a day (QID) | ORAL | Status: DC

## 2013-08-21 NOTE — Progress Notes (Unsigned)
Patient ID: Gwendolyn Cervantes, female   DOB: 1960-10-28, 53 y.o.   MRN: [id number] Patient Demographics  Gwendolyn Cervantes, is a 53 y.o. female  QMV:[id number]  MWU:[id number]  DOB - [birth year]  Chief Complaint  Patient presents with  . Follow-up        Subjective:   Gwendolyn Cervantes today is here for a follow up visit.  Patient reports that her back pain has not improved. Her MRI of the lumbar spine was scheduled yesterday but she was called by the MRI department that the machine was broken and it is now rescheduled to 09/01/2013.  No neurological deficits. Patient is lying on the examination table and states it's difficult for her to get up. She was prescribed Robaxin Naprosyn and Percocet in the ED which she has finished. She was given prescription for prednisone by clinic last visit which she has a refill for.  Patient has No headache, No chest pain, No abdominal pain - No Nausea, No Cough - SOB  Objective:    Filed Vitals:   08/21/13 0948  BP: 115/74  Pulse: 73  Temp: 97.7 F (36.5 C)  TempSrc: Oral  Resp: 16  Height: 5\' 9"  (1.753 m)  Weight: 246 lb (111.585 kg)  SpO2: 100%     ALLERGIES:  No Known Allergies  PAST MEDICAL HISTORY: History reviewed. No pertinent past medical history.  MEDICATIONS AT HOME: Prior to Admission medications   Medication Sig Start Date End Date Taking? Authorizing Provider  acetaminophen (TYLENOL) 500 MG tablet Take 1,500 mg by mouth every 6 (six) hours as needed (pain).    Yes Historical Provider, MD  ibuprofen (ADVIL,MOTRIN) 200 MG tablet Take 1-4 tablets (200-800 mg total) by mouth every 6 (six) hours as needed for moderate pain (also available OTC). 08/21/13  Yes Matelyn Antonelli Jenna Luo, MD  methocarbamol (ROBAXIN) 500 MG tablet Take 1 tablet (500 mg total) by mouth 4 (four) times daily. 08/21/13  Yes Haven Pylant Jenna Luo, MD  naproxen (NAPROSYN) 500 MG tablet Take 1 tablet (500 mg total) by mouth 2 (two) times daily with a meal. 08/21/13  Yes Dayane Hillenburg  K Leland Staszewski, MD  oxyCODONE-acetaminophen (PERCOCET/ROXICET) 5-325 MG per tablet Take 1 tablet by mouth every 6 (six) hours as needed for severe pain. 08/04/13  Yes Heather Laisure, PA-C  acetaminophen-codeine (TYLENOL #3) 300-30 MG per tablet Take 1-2 tablets by mouth every 6 (six) hours as needed for moderate pain. 08/21/13   Ellisyn Icenhower Jenna Luo, MD  ketorolac (TORADOL) 30 MG/ML injection Inject 1 mL (30 mg total) into the muscle once. 08/07/13   Richarda Overlie, MD     Exam  General appearance :Awake, alert, NAD, Speech Clear.  HEENT: Atraumatic and Normocephalic, PERLA Neck: supple, no JVD. No cervical lymphadenopathy.  Chest: Clear to auscultation bilaterally, no wheezing, rales or rhonchi CVS: S1 S2 regular, no murmurs.  Abdomen: soft, NBS, NT, ND, no gaurding, rigidity or rebound. Extremities: no cyanosis or clubbing, B/L Lower Ext shows no edema Neurology: Awake alert, and oriented X 3, CN II-XII intact, Non focal Skin: No Rash or lesions Wounds:N/A    Data Review   Basic Metabolic Panel: No results found for this basename: NA, K, CL, CO2, GLUCOSE, BUN, CREATININE, CALCIUM, MG, PHOS,  in the last 168 hours Liver Function Tests: No results found for this basename: AST, ALT, ALKPHOS, BILITOT, PROT, ALBUMIN,  in the last 168 hours  CBC: No results found for this basename: WBC, NEUTROABS, HGB, HCT, MCV, PLT,  in the last 168 hours  ------------------------------------------------------------------------------------------------------------------  No results found for this basename: HGBA1C,  in the last 72 hours ------------------------------------------------------------------------------------------------------------------ No results found for this basename: CHOL, HDL, LDLCALC, TRIG, CHOLHDL, LDLDIRECT,  in the last 72 hours ------------------------------------------------------------------------------------------------------------------ No results found for this basename: TSH, T4TOTAL, FREET3,  T3FREE, THYROIDAB,  in the last 72 hours ------------------------------------------------------------------------------------------------------------------ No results found for this basename: VITAMINB12, FOLATE, FERRITIN, TIBC, IRON, RETICCTPCT,  in the last 72 hours  Coagulation profile  No results found for this basename: INR, PROTIME,  in the last 168 hours    Assessment & Plan   Active Problems: Acute on chronic back pain - MRI lumbar spine rescheduled to Feb 3rd  - refilled Naprosyn, Robaxin - Explained to the patient that I will not be able to fill her Percocet.  I have given 60 tablets of Tylenol No. 3 with no refills until the next followup appointment in 4 weeks and for the followup of MRI results.   Recommendations: follow MRI Follow-up in4 weeks     Hollyn Stucky M.D. 08/21/2013, 10:16 AM

## 2013-08-21 NOTE — Progress Notes (Unsigned)
Pt is here following up on her lower back pain. 

## 2013-08-25 ENCOUNTER — Ambulatory Visit: Payer: Self-pay

## 2013-08-25 ENCOUNTER — Inpatient Hospital Stay: Payer: Self-pay

## 2013-09-01 ENCOUNTER — Ambulatory Visit (HOSPITAL_COMMUNITY): Payer: Self-pay

## 2013-09-01 ENCOUNTER — Ambulatory Visit (HOSPITAL_COMMUNITY): Admission: RE | Admit: 2013-09-01 | Payer: Self-pay | Source: Ambulatory Visit

## 2013-10-02 ENCOUNTER — Ambulatory Visit: Payer: Self-pay | Admitting: Internal Medicine

## 2013-10-14 ENCOUNTER — Ambulatory Visit: Payer: Self-pay

## 2013-10-26 ENCOUNTER — Ambulatory Visit: Payer: No Typology Code available for payment source | Attending: Internal Medicine | Admitting: Internal Medicine

## 2013-10-26 ENCOUNTER — Other Ambulatory Visit (HOSPITAL_COMMUNITY)
Admission: RE | Admit: 2013-10-26 | Discharge: 2013-10-26 | Disposition: A | Discharge status: Home / Self Care | Payer: No Typology Code available for payment source | Source: Ambulatory Visit | Attending: Internal Medicine | Admitting: Internal Medicine

## 2013-10-26 VITALS — BP 130/85 | HR 64 | Temp 98.1°F | Ht 69.0 in | Wt 242.0 lb

## 2013-10-26 DIAGNOSIS — N760 Acute vaginitis: Secondary | ICD-10-CM | POA: Insufficient documentation

## 2013-10-26 DIAGNOSIS — Z113 Encounter for screening for infections with a predominantly sexual mode of transmission: Secondary | ICD-10-CM | POA: Insufficient documentation

## 2013-10-26 DIAGNOSIS — F17200 Nicotine dependence, unspecified, uncomplicated: Secondary | ICD-10-CM | POA: Insufficient documentation

## 2013-10-26 DIAGNOSIS — Z791 Long term (current) use of non-steroidal anti-inflammatories (NSAID): Secondary | ICD-10-CM | POA: Insufficient documentation

## 2013-10-26 DIAGNOSIS — Z79899 Other long term (current) drug therapy: Secondary | ICD-10-CM | POA: Insufficient documentation

## 2013-10-26 LAB — POCT URINALYSIS DIPSTICK
BILIRUBIN UA: NEGATIVE
Glucose, UA: NEGATIVE
KETONES UA: NEGATIVE
LEUKOCYTES UA: NEGATIVE
Nitrite, UA: NEGATIVE
PH UA: 5.5
Protein, UA: NEGATIVE
Spec Grav, UA: 1.02
Urobilinogen, UA: 0.2

## 2013-10-26 MED ORDER — FLUCONAZOLE 150 MG PO TABS: 150.0000 mg | ORAL_TABLET | Freq: Once | ORAL | Status: DC

## 2013-10-26 MED ORDER — METRONIDAZOLE 500 MG PO TABS: 500.0000 mg | ORAL_TABLET | Freq: Three times a day (TID) | ORAL | Status: DC

## 2013-10-26 NOTE — Progress Notes (Signed)
Patient is in clinic for vaginal discharge that has been going on for 3 months. Slight odor and slight color to it. No vaginal  pain

## 2013-10-26 NOTE — Progress Notes (Signed)
Patient ID: Gwendolyn EmmsCharlene Theresa Eyerman, female   DOB: 1960-10-10, 53 y.o.   MRN: [id number]   CC:  HPI: 53 year old female comes in with oudorless yellowish vaginal discharge which has been present for the last 3 months. The patient denies any dysuria, urgency, frequency. She uses K-Y jelly during intercourse. She denies any similar symptoms in her partner. She status post partial hysterectomy and has not had a recent Pap smear. She denies any ulcerations or any vaginal bleeding or itching   No Known Allergies No past medical history on file. Current Outpatient Prescriptions on File Prior to Visit  Medication Sig Dispense Refill  . acetaminophen (TYLENOL) 500 MG tablet Take 1,500 mg by mouth every 6 (six) hours as needed (pain).       Marland Kitchen. acetaminophen-codeine (TYLENOL #3) 300-30 MG per tablet Take 1-2 tablets by mouth every 6 (six) hours as needed for moderate pain.  60 tablet  0  . ibuprofen (ADVIL,MOTRIN) 200 MG tablet Take 1-4 tablets (200-800 mg total) by mouth every 6 (six) hours as needed for moderate pain (also available OTC).  90 tablet  3  . ketorolac (TORADOL) 30 MG/ML injection Inject 1 mL (30 mg total) into the muscle once.  1 mL  0  . methocarbamol (ROBAXIN) 500 MG tablet Take 1 tablet (500 mg total) by mouth 4 (four) times daily.  120 tablet  1  . naproxen (NAPROSYN) 500 MG tablet Take 1 tablet (500 mg total) by mouth 2 (two) times daily with a meal.  60 tablet  1  . oxyCODONE-acetaminophen (PERCOCET/ROXICET) 5-325 MG per tablet Take 1 tablet by mouth every 6 (six) hours as needed for severe pain.  15 tablet  0   No current facility-administered medications on file prior to visit.   Family History  Problem Relation Age of Onset  . Hypertension Other    History   Social History  . Marital Status: Divorced    Spouse Name: N/A    Number of Children: N/A  . Years of Education: N/A   Occupational History  . Not on file.   Social History Main Topics  . Smoking status: Current  Every Day Smoker -- 0.50 packs/day  . Smokeless tobacco: Not on file  . Alcohol Use: No  . Drug Use: No     Comment: last reported use 8 days ago when entering [facility]  . Sexual Activity:    Other Topics Concern  . Not on file   Social History Narrative  . No narrative on file    Review of Systems  Constitutional: Negative for fever, chills, diaphoresis, activity change, appetite change and fatigue.  HENT: Negative for ear pain, nosebleeds, congestion, facial swelling, rhinorrhea, neck pain, neck stiffness and ear discharge.   Eyes: Negative for pain, discharge, redness, itching and visual disturbance.  Respiratory: Negative for cough, choking, chest tightness, shortness of breath, wheezing and stridor.   Cardiovascular: Negative for chest pain, palpitations and leg swelling.  Gastrointestinal: Negative for abdominal distention.  Genitourinary: As in history of present illness  Musculoskeletal: Negative for back pain, joint swelling, arthralgias and gait problem.  Neurological: Negative for dizziness, tremors, seizures, syncope, facial asymmetry, speech difficulty, weakness, light-headedness, numbness and headaches.  Hematological: Negative for adenopathy. Does not bruise/bleed easily.  Psychiatric/Behavioral: Negative for hallucinations, behavioral problems, confusion, dysphoric mood, decreased concentration and agitation.    Objective:   Filed Vitals:   10/26/13 1211  BP: 130/85  Pulse: 64  Temp: 98.1 F (36.7 C)    Physical  Exam  Constitutional: Appears well-developed and well-nourished. No distress.  HENT: Normocephalic. External right and left ear normal. Oropharynx is clear and moist.  Eyes: Conjunctivae and EOM are normal. PERRLA, no scleral icterus.  Neck: Normal ROM. Neck supple. No JVD. No tracheal deviation. No thyromegaly.  CVS: RRR, S1/S2 +, no murmurs, no gallops, no carotid bruit.  Pulmonary: Effort and breath sounds normal, no stridor, rhonchi, wheezes,  rales.  Abdominal: Soft. BS +,  no distension, tenderness, rebound or guarding.  Musculoskeletal: Normal range of motion. No edema and no tenderness.  Lymphadenopathy: No lymphadenopathy noted, cervical, inguinal. Neuro: Alert. Normal reflexes, muscle tone coordination. No cranial nerve deficit. Skin: Skin is warm and dry. No rash noted. Not diaphoretic. No erythema. No pallor.  Psychiatric: Normal mood and affect. Behavior, judgment, thought content normal.   Lab Results  Component Value Date   WBC 8.2 07/31/2013   HGB 15.3* 07/31/2013   HCT 43.9 07/31/2013   MCV 97.6 07/31/2013   PLT 228 07/31/2013   Lab Results  Component Value Date   CREATININE 0.87 07/31/2013   BUN 13 07/31/2013   NA 143 07/31/2013   K 4.9 07/31/2013   CL 107 07/31/2013   CO2 24 07/31/2013    No results found for this basename: HGBA1C   Lipid Panel     Component Value Date/Time   CHOL 203* 09/10/2008 0000   TRIG 103 09/10/2008 0000   HDL 68 09/10/2008 0000   CHOLHDL 3.0 Ratio 09/10/2008 0000   VLDL 21 09/10/2008 0000   LDLCALC 114* 09/10/2008 0000       Assessment and plan:   Patient Active Problem List   Diagnosis Date Noted  . Back pain, acute 08/21/2013  . 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 OBESITY 09/10/2008  . MASTITIS 06/11/2008  . HYPERTENSION, BENIGN ESSENTIAL 10/23/2007  . DENTAL CARIES 06/19/2007  . CLIMACTERIC STATE, FEMALE 06/19/2007  . ONYCHIA AND PARONYCHIA OF TOE 06/19/2007  . TINNITUS, RIGHT 05/30/2007  . TOBACCO ABUSE 05/16/2007  . ALLERGIC RHINITIS 05/16/2007  . GERD 05/16/2007  . HEEL PAIN, RIGHT 05/16/2007   Vaginitis Likely fungal We'll prescribe Diflucan for 3 days Empiric Flagyl for 7 days Urine dipstick to rule out UTI If the symptoms do not correlate in 2 weeks and the patient needs a full cervical vaginal exam, this can be combined with her physical. Patient has a repeat appointment in   April for a full physical   The patient was given clear instructions to go to ER or return to medical  center if symptoms don't improve, worsen or new problems develop. The patient verbalized understanding. The patient was told to call to get any lab results if not heard anything in the next week.

## 2013-10-27 LAB — URINE CYTOLOGY ANCILLARY ONLY
Chlamydia: NEGATIVE
NEISSERIA GONORRHEA: NEGATIVE

## 2013-10-27 LAB — HIV ANTIBODY (ROUTINE TESTING W REFLEX): HIV: NONREACTIVE

## 2013-10-28 ENCOUNTER — Telehealth: Payer: Self-pay | Admitting: Emergency Medicine

## 2013-10-28 NOTE — Telephone Encounter (Signed)
Pt given lab results 

## 2013-10-28 NOTE — Telephone Encounter (Signed)
Message copied by Darlis LoanSMITH, JILL D on Wed Oct 28, 2013  5:33 PM ------      Message from: Susie CassetteABROL MD, [facility]YA[facility]      Created: Wed Oct 28, 2013 11:10 AM       Negative test for HIV, gonorrhea, Chlamydia ------

## 2013-11-05 ENCOUNTER — Ambulatory Visit: Payer: Self-pay | Admitting: Internal Medicine

## 2014-04-10 ENCOUNTER — Emergency Department (HOSPITAL_COMMUNITY)
Admission: EM | Admit: 2014-04-10 | Discharge: 2014-04-11 | Disposition: A | Discharge status: Home / Self Care | Payer: No Typology Code available for payment source | Attending: Emergency Medicine | Admitting: Emergency Medicine

## 2014-04-10 ENCOUNTER — Encounter (HOSPITAL_COMMUNITY): Payer: Self-pay | Admitting: Emergency Medicine

## 2014-04-10 DIAGNOSIS — K6289 Other specified diseases of anus and rectum: Secondary | ICD-10-CM | POA: Insufficient documentation

## 2014-04-10 DIAGNOSIS — F17200 Nicotine dependence, unspecified, uncomplicated: Secondary | ICD-10-CM | POA: Insufficient documentation

## 2014-04-10 DIAGNOSIS — K649 Unspecified hemorrhoids: Secondary | ICD-10-CM | POA: Insufficient documentation

## 2014-04-10 DIAGNOSIS — A630 Anogenital (venereal) warts: Secondary | ICD-10-CM | POA: Insufficient documentation

## 2014-04-10 DIAGNOSIS — Z90710 Acquired absence of both cervix and uterus: Secondary | ICD-10-CM | POA: Insufficient documentation

## 2014-04-10 DIAGNOSIS — Z9089 Acquired absence of other organs: Secondary | ICD-10-CM | POA: Insufficient documentation

## 2014-04-10 LAB — WET PREP, GENITAL
CLUE CELLS WET PREP: NONE SEEN
Trich, Wet Prep: NONE SEEN
Yeast Wet Prep HPF POC: NONE SEEN

## 2014-04-10 LAB — URINE MICROSCOPIC-ADD ON

## 2014-04-10 LAB — COMPREHENSIVE METABOLIC PANEL
ALBUMIN: 4.3 g/dL (ref 3.5–5.2)
ALK PHOS: 81 U/L (ref 39–117)
ALT: 35 U/L (ref 0–35)
AST: 53 U/L — ABNORMAL HIGH (ref 0–37)
Anion gap: 13 (ref 5–15)
BUN: 17 mg/dL (ref 6–23)
CHLORIDE: 101 meq/L (ref 96–112)
CO2: 24 mEq/L (ref 19–32)
Calcium: 10 mg/dL (ref 8.4–10.5)
Creatinine, Ser: 0.94 mg/dL (ref 0.50–1.10)
GFR calc Af Amer: 78 mL/min — ABNORMAL LOW (ref >90)
GFR calc non Af Amer: 68 mL/min — ABNORMAL LOW (ref >90)
Glucose, Bld: 122 mg/dL — ABNORMAL HIGH (ref 70–99)
POTASSIUM: 4.6 meq/L (ref 3.7–5.3)
Sodium: 138 mEq/L (ref 137–147)
Total Bilirubin: 0.5 mg/dL (ref 0.3–1.2)
Total Protein: 8.2 g/dL (ref 6.0–8.3)

## 2014-04-10 LAB — URINALYSIS, ROUTINE W REFLEX MICROSCOPIC
GLUCOSE, UA: NEGATIVE mg/dL
KETONES UR: 15 mg/dL — AB
LEUKOCYTES UA: NEGATIVE
Nitrite: NEGATIVE
PH: 5 (ref 5.0–8.0)
Protein, ur: NEGATIVE mg/dL
Specific Gravity, Urine: 1.035 — ABNORMAL HIGH (ref 1.005–1.030)
Urobilinogen, UA: 0.2 mg/dL (ref 0.0–1.0)

## 2014-04-10 LAB — CBC
HCT: 48.1 % — ABNORMAL HIGH (ref 36.0–46.0)
Hemoglobin: 17.1 g/dL — ABNORMAL HIGH (ref 12.0–15.0)
MCH: 34 pg (ref 26.0–34.0)
MCHC: 35.6 g/dL (ref 30.0–36.0)
MCV: 95.6 fL (ref 78.0–100.0)
Platelets: 251 10*3/uL (ref 150–400)
RBC: 5.03 MIL/uL (ref 3.87–5.11)
RDW: 13.4 % (ref 11.5–15.5)
WBC: 9.5 10*3/uL (ref 4.0–10.5)

## 2014-04-10 MED ORDER — OXYCODONE-ACETAMINOPHEN 5-325 MG PO TABS
1.0000 | ORAL_TABLET | Freq: Once | ORAL | Status: AC
  Administered 2014-04-10: 1 via ORAL
  Filled 2014-04-10: qty 1

## 2014-04-10 NOTE — ED Notes (Signed)
Patient denies bleeding, fever, abdominal pain, dizziness, nausea, vomiting, diarrhea, and constipation.

## 2014-04-10 NOTE — ED Notes (Signed)
Patient arrives from home with complaint of rectal pain and vaginal burning. States long history of hemorrhoids with corrective surgery 30+ years ago. Explains that since that time she has had some discomfort from them but never this bad. Recently the pain has gotten drastically worse. States "I got a mirror and looked down there, and I don't know what I saw", explains that these look totally different than before. Additionally reports burning in vaginal area and explains that she was having trouble with vaginal burning a month or so ago and was seen health clinic and treated with ABX.

## 2014-04-11 LAB — RPR

## 2014-04-11 LAB — HIV ANTIBODY (ROUTINE TESTING W REFLEX): HIV 1&2 Ab, 4th Generation: NONREACTIVE

## 2014-04-11 MED ORDER — OXYCODONE-ACETAMINOPHEN 5-325 MG PO TABS: 1.0000 | ORAL_TABLET | Freq: Four times a day (QID) | ORAL | Status: AC | PRN

## 2014-04-11 NOTE — ED Notes (Signed)
Patient resting watching television.

## 2014-04-11 NOTE — Discharge Instructions (Signed)
Genital Warts Genital warts are a sexually transmitted infection. They may appear as small bumps on the tissues of the genital area. CAUSES  Genital warts are caused by a virus called human papillomavirus (HPV). HPV is the most common sexually transmitted disease (STD) and infection of the sex organs. This infection is spread by having unprotected sex with an infected person. It can be spread by vaginal, anal, and oral sex. Many people do not know they are infected. They may be infected for years without problems. However, even if they do not have problems, they can unknowingly pass the infection to their sexual partners. SYMPTOMS   Itching and irritation in the genital area.  Warts that bleed.  Painful sexual intercourse. DIAGNOSIS  Warts are usually recognized with the naked eye on the vagina, vulva, perineum, anus, and rectum. Certain tests can also diagnose genital warts, such as:  A Pap test.  A tissue sample (biopsy) exam.  Colposcopy. A magnifying tool is used to examine the vagina and cervix. The HPV cells will change color when certain solutions are used. TREATMENT  Warts can be removed by:  Applying certain chemicals, such as cantharidin or podophyllin.  Liquid nitrogen freezing (cryotherapy).  Immunotherapy with Candida or Trichophyton injections.  Laser treatment.  Burning with an electrified probe (electrocautery).  Interferon injections.  Surgery. PREVENTION  HPV vaccination can help prevent HPV infections that cause genital warts and that cause cancer of the cervix. It is recommended that the vaccination be given to people between the ages 589 to 55 years old. The vaccine might not work as well or might not work at all if you already have HPV. It should not be given to pregnant women. HOME CARE INSTRUCTIONS   It is important to follow your caregiver's instructions. The warts will not go away without treatment. Repeat treatments are often needed to get rid of warts.  Even after it appears that the warts are gone, the normal tissue underneath often remains infected.  Do not try to treat genital warts with medicine used to treat hand warts. This type of medicine is strong and can burn the skin in the genital area, causing more damage.  Tell your past and current sexual partner(s) that you have genital warts. They may be infected also and need treatment.  Avoid sexual contact while being treated.  Do not touch or scratch the warts. The infection may spread to other parts of your body.  Women with genital warts should have a cervical cancer check (Pap test) at least once a year. This type of cancer is slow-growing and can be cured if found early. Chances of developing cervical cancer are increased with HPV.  Inform your obstetrician about your warts in the event of pregnancy. This virus can be passed to the baby's respiratory tract. Discuss this with your caregiver.  Use a condom during sexual intercourse. Following treatment, the use of condoms will help prevent reinfection.  Ask your caregiver about using over-the-counter anti-itch creams. SEEK MEDICAL CARE IF:   Your treated skin becomes red, swollen, or painful.  You have a fever.  You feel generally ill.  You feel little lumps in and around your genital area.  You are bleeding or have painful sexual intercourse. MAKE SURE YOU:   Understand these instructions.  Will watch your condition.  Will get help right away if you are not doing well or get worse. Document Released: 07/13/2000 Document Revised: 11/30/2013 Document Reviewed: 01/22/2011 [facility] Patient Information 2015 [place], [place]. This  information is not intended to replace advice given to you by your health care provider. Make sure you discuss any questions you have with your health care provider.  Hemorrhoids Hemorrhoids are swollen veins around the rectum or anus. There are two types of hemorrhoids:   Internal hemorrhoids.  These occur in the veins just inside the rectum. They may poke through to the outside and become irritated and painful.  External hemorrhoids. These occur in the veins outside the anus and can be felt as a painful swelling or hard lump near the anus. CAUSES  Pregnancy.   Obesity.   Constipation or diarrhea.   Straining to have a bowel movement.   Sitting for long periods on the toilet.  Heavy lifting or other activity that caused you to strain.  Anal intercourse. SYMPTOMS   Pain.   Anal itching or irritation.   Rectal bleeding.   Fecal leakage.   Anal swelling.   One or more lumps around the anus.  DIAGNOSIS  Your caregiver may be able to diagnose hemorrhoids by visual examination. Other examinations or tests that may be performed include:   Examination of the rectal area with a gloved hand (digital rectal exam).   Examination of anal canal using a small tube (scope).   A blood test if you have lost a significant amount of blood.  A test to look inside the colon (sigmoidoscopy or colonoscopy). TREATMENT Most hemorrhoids can be treated at home. However, if symptoms do not seem to be getting better or if you have a lot of rectal bleeding, your caregiver may perform a procedure to help make the hemorrhoids get smaller or remove them completely. Possible treatments include:   Placing a rubber band at the base of the hemorrhoid to cut off the circulation (rubber band ligation).   Injecting a chemical to shrink the hemorrhoid (sclerotherapy).   Using a tool to burn the hemorrhoid (infrared light therapy).   Surgically removing the hemorrhoid (hemorrhoidectomy).   Stapling the hemorrhoid to block blood flow to the tissue (hemorrhoid stapling).  HOME CARE INSTRUCTIONS   Eat foods with fiber, such as whole grains, beans, nuts, fruits, and vegetables. Ask your doctor about taking products with added fiber in them (fibersupplements).  Increase fluid  intake. Drink enough water and fluids to keep your urine clear or pale yellow.   Exercise regularly.   Go to the bathroom when you have the urge to have a bowel movement. Do not wait.   Avoid straining to have bowel movements.   Keep the anal area dry and clean. Use wet toilet paper or moist towelettes after a bowel movement.   Medicated creams and suppositories may be used or applied as directed.   Only take over-the-counter or prescription medicines as directed by your caregiver.   Take warm sitz baths for 15-20 minutes, 3-4 times a day to ease pain and discomfort.   Place ice packs on the hemorrhoids if they are tender and swollen. Using ice packs between sitz baths may be helpful.   Put ice in a plastic bag.   Place a towel between your skin and the bag.   Leave the ice on for 15-20 minutes, 3-4 times a day.   Do not use a donut-shaped pillow or sit on the toilet for long periods. This increases blood pooling and pain.  SEEK MEDICAL CARE IF:  You have increasing pain and swelling that is not controlled by treatment or medicine.  You have uncontrolled bleeding.  You  have difficulty or you are unable to have a bowel movement.  You have pain or inflammation outside the area of the hemorrhoids. MAKE SURE YOU:  Understand these instructions.  Will watch your condition.  Will get help right away if you are not doing well or get worse. Document Released: 07/13/2000 Document Revised: 07/02/2012 Document Reviewed: 05/20/2012 [facility] Patient Information 2015 [place], [place]. This information is not intended to replace advice given to you by your health care provider. Make sure you discuss any questions you have with your health care provider.

## 2014-04-11 NOTE — ED Provider Notes (Signed)
CSN: [id number]     Arrival date & time 04/10/14  2155 History   First MD Initiated Contact with Patient 04/10/14 2242     Chief Complaint  Patient presents with  . Rectal Pain  . Vaginitis     (Consider location/radiation/quality/duration/timing/severity/associated sxs/prior Treatment) The history is provided by the patient.  patient with rectal pain. Has been going for a few days. History of hemorrhoids. States pain with bowel movements. Also sometimes pain with sitting. No fevers. Occasional nausea. No vomiting. No blood in stool. Also has mild vaginal discharge. No foul smell. She denies possibility of STD.  History reviewed. No pertinent past medical history. Past Surgical History  Procedure Laterality Date  . Abdominal hysterectomy    . Cholecystectomy     Family History  Problem Relation Age of Onset  . Hypertension Other    History  Substance Use Topics  . Smoking status: Current Every Day Smoker -- 0.50 packs/day  . Smokeless tobacco: Not on file  . Alcohol Use: No   OB History   Grav Para Term Preterm Abortions TAB SAB Ect Mult Living                 Review of Systems  Constitutional: Negative for activity change and appetite change.  Eyes: Negative for pain.  Respiratory: Negative for chest tightness and shortness of breath.   Cardiovascular: Negative for chest pain and leg swelling.  Gastrointestinal: Positive for rectal pain. Negative for nausea, vomiting, abdominal pain and diarrhea.  Genitourinary: Positive for vaginal discharge. Negative for flank pain.  Musculoskeletal: Negative for back pain and neck stiffness.  Skin: Negative for rash.  Neurological: Negative for weakness, numbness and headaches.  Psychiatric/Behavioral: Negative for behavioral problems.      Allergies  Review of patient's allergies indicates no known allergies.  Home Medications   Prior to Admission medications   Medication Sig Start Date End Date Taking? Authorizing  Provider  oxyCODONE-acetaminophen (PERCOCET/ROXICET) 5-325 MG per tablet Take 1 tablet by mouth every 6 (six) hours as needed for severe pain. 04/11/14   Juliet Rude. Molina Hollenback, MD   BP 131/75  Pulse 75  Temp(Src) 98.4 F (36.9 C) (Oral)  Resp 18  Ht  (1.753 m)  Wt 240 lb (108.863 kg)  BMI 35.43 kg/m2  SpO2 96% Physical Exam  Nursing note and vitals reviewed. Constitutional: She is oriented to person, place, and time. She appears well-developed and well-nourished.  HENT:  Head: Normocephalic and atraumatic.  Eyes: EOM are normal. Pupils are equal, round, and reactive to light.  Neck: Normal range of motion. Neck supple.  Cardiovascular: Normal rate, regular rhythm and normal heart sounds.   No murmur heard. Pulmonary/Chest: Effort normal and breath sounds normal. No respiratory distress. She has no wheezes. She has no rales.  Abdominal: Soft. Bowel sounds are normal. She exhibits no distension. There is no tenderness. There is no rebound and no guarding.  Genitourinary:  Pelvic exam showed minimal vaginal discharge. On palpation not able to palpate a cervix, but there was a rounded structure to the right superior area that could be from sewed cuff. External hemorrhoids without thrombosis or bleeding  Musculoskeletal: Normal range of motion.  Neurological: She is alert and oriented to person, place, and time. No cranial nerve deficit.  Skin: Skin is warm and dry.  Psychiatric: She has a normal mood and affect. Her speech is normal.    ED Course  Procedures (including critical care time) Labs Review Labs Reviewed  WET  PREP, GENITAL - Abnormal; Notable for the following:    WBC, Wet Prep HPF POC FEW (*)    All other components within normal limits  CBC - Abnormal; Notable for the following:    Hemoglobin 17.1 (*)    HCT 48.1 (*)    All other components within normal limits  COMPREHENSIVE METABOLIC PANEL - Abnormal; Notable for the following:    Glucose, Bld 122 (*)    AST 53  (*)    GFR calc non Af Amer 68 (*)    GFR calc Af Amer 78 (*)    All other components within normal limits  URINALYSIS, ROUTINE W REFLEX MICROSCOPIC - Abnormal; Notable for the following:    Color, Urine AMBER (*)    APPearance CLOUDY (*)    Specific Gravity, Urine 1.035 (*)    Hgb urine dipstick TRACE (*)    Bilirubin Urine SMALL (*)    Ketones, ur 15 (*)    All other components within normal limits  URINE MICROSCOPIC-ADD ON - Abnormal; Notable for the following:    Bacteria, UA FEW (*)    All other components within normal limits  GC/CHLAMYDIA PROBE AMP  RPR  HIV ANTIBODY (ROUTINE TESTING)    Imaging Review No results found.   EKG Interpretation None      MDM   Final diagnoses:  Condyloma acuminata  Hemorrhoids, unspecified hemorrhoid type    Patient with rectal pain. Has hemorrhoids without thrombosis. No tenderness on rectal exam. 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 Will give little bit of pain medicine and followup.    Juliet Rude. Rubin Payor, MD 04/11/14 0040

## 2014-04-11 NOTE — ED Notes (Signed)
Patient is alert and orientedx4.  Patient was explained discharge instructions and they understood them with no questions.   

## 2014-04-12 LAB — GC/CHLAMYDIA PROBE AMP
CT PROBE, AMP APTIMA: NEGATIVE
GC PROBE AMP APTIMA: NEGATIVE

## 2014-05-07 ENCOUNTER — Encounter (HOSPITAL_COMMUNITY): Payer: Self-pay | Admitting: Family Medicine

## 2014-05-07 ENCOUNTER — Emergency Department (INDEPENDENT_AMBULATORY_CARE_PROVIDER_SITE_OTHER)
Admission: EM | Admit: 2014-05-07 | Discharge: 2014-05-07 | Disposition: A | Discharge status: Home / Self Care | Payer: Self-pay | Source: Home / Self Care | Attending: Family Medicine | Admitting: Family Medicine

## 2014-05-07 DIAGNOSIS — L03312 Cellulitis of back [any part except buttock]: Secondary | ICD-10-CM

## 2014-05-07 DIAGNOSIS — R109 Unspecified abdominal pain: Secondary | ICD-10-CM

## 2014-05-07 DIAGNOSIS — L03319 Cellulitis of trunk, unspecified: Secondary | ICD-10-CM

## 2014-05-07 DIAGNOSIS — M7989 Other specified soft tissue disorders: Secondary | ICD-10-CM

## 2014-05-07 LAB — URIC ACID: URIC ACID, SERUM: 3.2 mg/dL (ref 2.4–7.0)

## 2014-05-07 MED ORDER — INDOMETHACIN 50 MG PO CAPS: 50.0000 mg | ORAL_CAPSULE | Freq: Three times a day (TID) | ORAL | Status: AC

## 2014-05-07 MED ORDER — FLUCONAZOLE 150 MG PO TABS
150.0000 mg | ORAL_TABLET | Freq: Every day | ORAL | Status: AC
Start: 2014-05-07 — End: ?

## 2014-05-07 MED ORDER — DOXYCYCLINE HYCLATE 100 MG PO TABS: 100.0000 mg | ORAL_TABLET | Freq: Two times a day (BID) | ORAL | Status: AC

## 2014-05-07 MED ORDER — FLUCONAZOLE 150 MG PO TABS: 150.0000 mg | ORAL_TABLET | Freq: Every day | ORAL | Status: DC

## 2014-05-07 NOTE — ED Notes (Signed)
Two days ago left side became sore, and what appeared to be a bug bite.  Pt has had no chest pain, or headaches or fevers.

## 2014-05-07 NOTE — ED Provider Notes (Signed)
CSN: [id number]     Arrival date & time 05/07/14  1516 History   First MD Initiated Contact with Patient 05/07/14 1531     Chief Complaint  Patient presents with  . Insect Bite   (Consider location/radiation/quality/duration/timing/severity/associated sxs/prior Treatment) HPI  Bug bite: L flank. Noticed yesterday. Painful and mildly itchy. Increasing in redness. Unsure of what if any but bit her. Getting worse. Denies SOB, rash, joint pains or effusions, CP, palpitations, fevers, HA.  No tick bites.  Also developed R thumb swelling and catching w/ flexion. No trauma. Point tenderness at the 1st R MCP joint.   History reviewed. No pertinent past medical history. Past Surgical History  Procedure Laterality Date  . Abdominal hysterectomy    . Cholecystectomy     Family History  Problem Relation Age of Onset  . Hypertension Other    History  Substance Use Topics  . Smoking status: Current Every Day Smoker -- 0.50 packs/day  . Smokeless tobacco: Not on file  . Alcohol Use: No   OB History   Grav Para Term Preterm Abortions TAB SAB Ect Mult Living                 Review of Systems Per HPI with all other pertinent systems negative.   Allergies  Review of patient's allergies indicates no known allergies.  Home Medications   Prior to Admission medications   Medication Sig Start Date End Date Taking? Authorizing Provider  doxycycline (VIBRA-TABS) 100 MG tablet Take 1 tablet (100 mg total) by mouth 2 (two) times daily. 05/07/14   Ozella Rocksavid J Kissy Cielo, MD  fluconazole (DIFLUCAN) 150 MG tablet Take 1 tablet (150 mg total) by mouth daily. Repeat dose in 3 days 05/07/14   Ozella Rocksavid J Finch Costanzo, MD  indomethacin (INDOCIN) 50 MG capsule Take 1 capsule (50 mg total) by mouth 3 (three) times daily with meals. 05/07/14   Ozella Rocksavid J Adaria Hole, MD  oxyCODONE-acetaminophen (PERCOCET/ROXICET) 5-325 MG per tablet Take 1 tablet by mouth every 6 (six) hours as needed for severe pain. 04/11/14   Juliet RudeNathan R. Pickering,  MD   BP 139/93  Pulse 109  Temp(Src) 98.5 F (36.9 C) (Oral)  Resp 16  SpO2 96% Physical Exam  Constitutional: She appears well-developed and well-nourished. No distress.  HENT:  Head: Normocephalic and atraumatic.  Eyes: EOM are normal. Pupils are equal, round, and reactive to light.  Neck: Normal range of motion.  Cardiovascular: Normal rate and normal heart sounds.   No murmur heard. Pulmonary/Chest: Effort normal and breath sounds normal. No respiratory distress.  Abdominal: Soft. She exhibits no distension.  Musculoskeletal:  L chest wall w/ superficial ttp and mild skin color change that is either from chronic rubbing from bra and breast tissue or from infection   Skin: She is not diaphoretic.  2.5x2cm erythematous, raised lesion w/ central pustule. Lanced w/ 18 gauge needle for culture.   Psychiatric: She has a normal mood and affect. Her behavior is normal. Judgment and thought content normal.    ED Course  Procedures (including critical care time) Labs Review Labs Reviewed  CULTURE, ROUTINE-ABSCESS  URIC ACID  B. BURGDORFI ANTIBODIES    Imaging Review No results found.   MDM   1. Cellulitis of flank   2. Flank pain   3. Thumb swelling    Doxy INdomethicin Diflucan if needed Possible gout vs arthritic changes from tick born illness Possible early shingles - pt to call if develops worsening chest wall or back pain and  rash. Pain may be from nearby cellulitis causing irritation of the dermatome Uric acid, Lyme, Ehrliciosis, wound culture sent Precautions given and all questions answered  Shelly Flattenavid Larita Deremer, MD Family Medicine 05/07/2014, 4:13 PM      Ozella Rocksavid J Alayha Babineaux, MD 05/07/14 [phone]

## 2014-05-07 NOTE — Discharge Instructions (Signed)
You have a skin infection that will require antibiotics to clear. Please start on the diflucan if you develop yeast infection type symptoms Please call us if you develop worsening pain and a rash on the left side of your chest and back. This may be the beginnings of shingles Please start the indomethicin for pain and for the swelling in the thumb We will check you for gout.  Please call if you have not heard anything by the middle of next week Have a great weekend.

## 2014-05-10 LAB — B. BURGDORFI ANTIBODIES: B burgdorferi Ab IgG+IgM: 0.4 {ISR}

## 2014-05-10 LAB — CULTURE, ROUTINE-ABSCESS: Gram Stain: NONE SEEN

## 2014-05-10 NOTE — ED Notes (Addendum)
Abscess culture L trunk: Few Staph Aureus. Pt. adequately treated with Doxycycline.  B. Burgdorfi neg., Uric acid: 3.2. Gwendolyn Cervantes, [name] M 05/10/2014

## 2014-05-13 ENCOUNTER — Telehealth (HOSPITAL_COMMUNITY): Payer: Self-pay | Admitting: *Deleted

## 2014-05-13 LAB — EHRLICHIA ANTIBODY PANEL
E chaffeensis (HGE) Ab, IgG: <1:64 {titer}
E chaffeensis (HGE) Ab, IgM: <1:20 {titer}

## 2014-05-13 NOTE — Progress Notes (Signed)
Called and spoke to pt. Cellulitis improving Joint pain worse and thumb swelling now twice the size of other thumb Uric Acid nml, Lyme titer nml Indomethacin w/o benefit (RA unlikely) Concern for infectious cause of tenosynovitis and possible septic joint Recommending pt go to ED for further eval  Shelly Flattenavid Syvilla Martin, MD Family Medicine 05/13/2014, 4:00 PM

## 2014-05-13 NOTE — ED Notes (Signed)
Pt. called for her lab results.  Pt. verified x 2 and given results.  Pt. Said the abscess is getting better but her thumb is getting worse.  She said the doctor thought it could be lyme disease, but result is neg.  She wants to know what to do now.  She was not given a referral for f/u.  I told her I would send a message to Dr. Konrad DoloresMerrell and he or I would call her back. Vassie MoselleYork, Ave Scharnhorst M 05/13/2014

## 2014-05-14 LAB — MISCELLANEOUS TEST

## 2014-05-15 ENCOUNTER — Encounter (HOSPITAL_COMMUNITY): Payer: Self-pay | Admitting: Emergency Medicine

## 2014-05-15 ENCOUNTER — Emergency Department (HOSPITAL_COMMUNITY)
Admission: EM | Admit: 2014-05-15 | Discharge: 2014-05-15 | Discharge status: Against Medical Advice | Payer: Self-pay | Attending: Emergency Medicine | Admitting: Emergency Medicine

## 2014-05-15 DIAGNOSIS — Z720 Tobacco use: Secondary | ICD-10-CM | POA: Insufficient documentation

## 2014-05-15 DIAGNOSIS — M79644 Pain in right finger(s): Secondary | ICD-10-CM | POA: Insufficient documentation

## 2014-05-15 NOTE — ED Notes (Signed)
Pt. reports right thumb pain/swelling for 4 weeks , denies injury , pain worse with movement /palpation .

## 2014-05-20 ENCOUNTER — Encounter (HOSPITAL_COMMUNITY): Payer: Self-pay | Admitting: Emergency Medicine

## 2014-05-20 ENCOUNTER — Emergency Department (HOSPITAL_COMMUNITY): Payer: Self-pay

## 2014-05-20 ENCOUNTER — Emergency Department (HOSPITAL_COMMUNITY)
Admission: EM | Admit: 2014-05-20 | Discharge: 2014-05-20 | Disposition: A | Discharge status: Home / Self Care | Payer: Self-pay | Attending: Emergency Medicine | Admitting: Emergency Medicine

## 2014-05-20 DIAGNOSIS — Z79899 Other long term (current) drug therapy: Secondary | ICD-10-CM | POA: Insufficient documentation

## 2014-05-20 DIAGNOSIS — Z720 Tobacco use: Secondary | ICD-10-CM | POA: Insufficient documentation

## 2014-05-20 DIAGNOSIS — M79641 Pain in right hand: Secondary | ICD-10-CM | POA: Insufficient documentation

## 2014-05-20 DIAGNOSIS — M79644 Pain in right finger(s): Secondary | ICD-10-CM

## 2014-05-20 DIAGNOSIS — Z791 Long term (current) use of non-steroidal anti-inflammatories (NSAID): Secondary | ICD-10-CM | POA: Insufficient documentation

## 2014-05-20 DIAGNOSIS — Z792 Long term (current) use of antibiotics: Secondary | ICD-10-CM | POA: Insufficient documentation

## 2014-05-20 MED ORDER — TRAMADOL HCL 50 MG PO TABS: 50.0000 mg | ORAL_TABLET | Freq: Four times a day (QID) | ORAL | Status: DC | PRN

## 2014-05-20 MED ORDER — INDOMETHACIN 25 MG PO CAPS: 25.0000 mg | ORAL_CAPSULE | Freq: Three times a day (TID) | ORAL | Status: AC

## 2014-05-20 MED ORDER — HYDROCODONE-ACETAMINOPHEN 5-325 MG PO TABS
2.0000 | ORAL_TABLET | Freq: Once | ORAL | Status: AC
  Administered 2014-05-20: 2 via ORAL
  Filled 2014-05-20: qty 2

## 2014-05-20 NOTE — ED Notes (Signed)
Declined W/C at D/C and was escorted to lobby by RN. 

## 2014-05-20 NOTE — Discharge Instructions (Signed)
1. Medications: indomethacin, usual home medications 2. Treatment: rest, drink plenty of fluids,  3. Follow Up: Please followup with your primary doctor in 3 days for discussion of your diagnoses and further evaluation after today's visit; if you do not have a primary care doctor use the resource guide provided to find one; Please return to the ER for redness, fevers or other signs of infection

## 2014-05-20 NOTE — ED Provider Notes (Signed)
CSN: [id number]     Arrival date & time 05/20/14  1745 History  This chart was scribed for non-physician practitioner, Dierdre ForthHannah Zaira Iacovelli, PA-C, working with Audree CamelScott T Goldston, MD, by Bronson CurbJacqueline Melvin, ED Scribe. This patient was seen in room TR05C/TR05C and the patient's care was started at 6:32 PM.      Chief Complaint  Patient presents with  . Hand Pain    The history is provided by the patient and medical records. No language interpreter was used.    HPI Comments: Gwendolyn EmmsCharlene Theresa Cervantes is a 53 y.o. female who presents to the Emergency Department complaining of worsening, throbbing right thumb pain that was intermittent 1 month ago, but has since become constant in the past 2 weeks. Patient was seen at Urgent Care 6 days ago and was tested for gout and lyme disease (via blood) which revealed negative findings, and no x-rays were taken. She states she was then informed to come to the ED. There is associated swelling and decreased ROM due to pain. Patient is unemployed and reports she does not do "anything repetitive". No rashes, fevers, recent falls or injury. Patient is has taken Tylenol without significant improvement. Patient has no history of significant health conditions. Patient is right hand dominant.   History reviewed. No pertinent past medical history. Past Surgical History  Procedure Laterality Date  . Abdominal hysterectomy    . Cholecystectomy     Family History  Problem Relation Age of Onset  . Hypertension Other    History  Substance Use Topics  . Smoking status: Current Every Day Smoker -- 0.50 packs/day  . Smokeless tobacco: Not on file  . Alcohol Use: No   OB History   Grav Para Term Preterm Abortions TAB SAB Ect Mult Living                 Review of Systems  Constitutional: Negative for fever and chills.  Gastrointestinal: Negative for nausea and vomiting.  Musculoskeletal: Positive for arthralgias (right thumb) and joint swelling (DIP right thumb).  Negative for back pain, neck pain and neck stiffness.  Skin: Negative for wound.  Neurological: Negative for numbness.  Hematological: Does not bruise/bleed easily.  Psychiatric/Behavioral: The patient is not nervous/anxious.   All other systems reviewed and are negative.     Allergies  Review of patient's allergies indicates no known allergies.  Home Medications   Prior to Admission medications   Medication Sig Start Date End Date Taking? Authorizing Provider  doxycycline (VIBRA-TABS) 100 MG tablet Take 1 tablet (100 mg total) by mouth 2 (two) times daily. 05/07/14   Ozella Rocksavid J Merrell, MD  fluconazole (DIFLUCAN) 150 MG tablet Take 1 tablet (150 mg total) by mouth daily. Repeat dose in 3 days 05/07/14   Ozella Rocksavid J Merrell, MD  indomethacin (INDOCIN) 25 MG capsule Take 1 capsule (25 mg total) by mouth 3 (three) times daily with meals. May take up to 50mg  three times a day if no improvement with 25mg . 05/20/14   Dahlia ClientHannah Maruice Pieroni, PA-C  indomethacin (INDOCIN) 50 MG capsule Take 1 capsule (50 mg total) by mouth 3 (three) times daily with meals. 05/07/14   Ozella Rocksavid J Merrell, MD  oxyCODONE-acetaminophen (PERCOCET/ROXICET) 5-325 MG per tablet Take 1 tablet by mouth every 6 (six) hours as needed for severe pain. 04/11/14   Juliet RudeNathan R. Pickering, MD   Triage Vitals: BP 150/91  Pulse 93  Temp(Src) 98 F (36.7 C) (Oral)  Resp 16  Ht 5\' 9"  (1.753 m)  Wt 232  lb (105.235 kg)  BMI 34.24 kg/m2  SpO2 98%  Physical Exam  Nursing note and vitals reviewed. Constitutional: She appears well-developed and well-nourished. No distress.  HENT:  Head: Normocephalic and atraumatic.  Eyes: Conjunctivae are normal.  Neck: Normal range of motion.  Cardiovascular: Normal rate, regular rhythm and intact distal pulses.   Capillary refill < 3 sec  Pulmonary/Chest: Effort normal and breath sounds normal.  Musculoskeletal: She exhibits tenderness. She exhibits no edema.  ROM: decreased ROM of the DIP of the right  thumb, Full ROM of the PIP and MCP of the right thumb with pain Swelling of the DIP without erythema, induration, laceration or lesion; no increased warmth; no paronychia or felon Positive grind test of the right MCP  Neurological: She is alert. She exhibits normal muscle tone. Coordination normal.  Sensation intact to dull and sharp in all fingers of the right hand Strength 5/5 in all fingers of the right hand with resisted flexion and extension except the DIP joint of the right thumb with strength of 2/5 due to pain and poor effort.    Skin: Skin is warm and dry. She is not diaphoretic. No erythema.  No tenting of the skin  Psychiatric: She has a normal mood and affect.    ED Course  Procedures (including critical care time)  DIAGNOSTIC STUDIES: Oxygen Saturation is 98% on room air, normal by my interpretation.    Labs Review Labs Reviewed - No data to display  Imaging Review Dg Finger Thumb Right  05/20/2014   CLINICAL DATA:  Right thumb swelling and redness for 2-3 weeks.  EXAM: RIGHT THUMB 2+V  COMPARISON:  None.  FINDINGS: There is no evidence of fracture or dislocation. There is no evidence of arthropathy or other focal bone abnormality. Soft tissues are unremarkable  IMPRESSION: No acute abnormality.   Electronically Signed   By: Sherian Rein M.D.   On: 05/20/2014 18:30     EKG Interpretation None      MDM   Final diagnoses:  Thumb pain, right   Gwendolyn Cervantes presents with right thumb pain and swelling.  Pt reports this has been ongoing for > 1 month.  Patient without systemic signs of infection. Thumb is swollen but without erythema or increased warmth to suggest septic joint. No lacerations or evidence of cellulitis. No evidence of paronychia or felon on exam. Patient is not at risk for bleeding infection. She denies history of diabetes, HIV, immunosuppression, chronic steroid use. Gout flare versus arthritis. Will obtain x-ray to assess for subacute injury.     X-ray without evidence of fracture or dislocation.  Patient continues to demand pain control. Discussed negative x-ray findings and possibility of gout versus arthritis.  Will treat with indomethacin as this will cover gout or arthritis. Patient is to followup with her primary care physician this week for referral to hand specialist.  I have personally reviewed patient's vitals, nursing note and any pertinent labs or imaging.  I performed an focused physical exam; undressed when appropriate .    It has been determined that no acute conditions requiring further emergency intervention are present at this time. The patient/guardian have been advised of the diagnosis and plan. I reviewed any labs and imaging including any potential incidental findings. We have discussed signs and symptoms that warrant return to the ED and they are listed in the discharge instructions.    Vital signs are stable at discharge.   BP 128/61  Pulse 67  Temp(Src) 97.8  F (36.6 C) (Oral)  Resp 16  Ht 5\' 9"  (1.753 m)  Wt 232 lb (105.235 kg)  BMI 34.24 kg/m2  SpO2 98%  I personally performed the services described in this documentation, which was scribed in my presence. The recorded information has been reviewed and is accurate.   Dahlia ClientHannah Clair Alfieri, PA-C 05/20/14 2117

## 2014-05-20 NOTE — ED Notes (Signed)
Pt c/o R thumb pain and swelling x 1 month.  Pain was intermittent at first, but now is constant and throbbing to the point that pt cannot bend her arm.  [facility] which was negative, per pt.

## 2014-05-21 NOTE — ED Provider Notes (Signed)
Medical screening examination/treatment/procedure(s) were performed by non-physician practitioner and as supervising physician I was immediately available for consultation/collaboration.   EKG Interpretation None        Audree CamelScott T Reeta Kuk, MD 05/21/14 [phone]

## 2016-01-09 IMAGING — CR DG FINGER THUMB 2+V*R*
3 series · 3 of 3 positions shown · non-contrast
Comparison: None.

CLINICAL DATA: Right thumb swelling and redness for 2-3 weeks.

EXAM:
RIGHT THUMB 2+V

[x finger pa right]
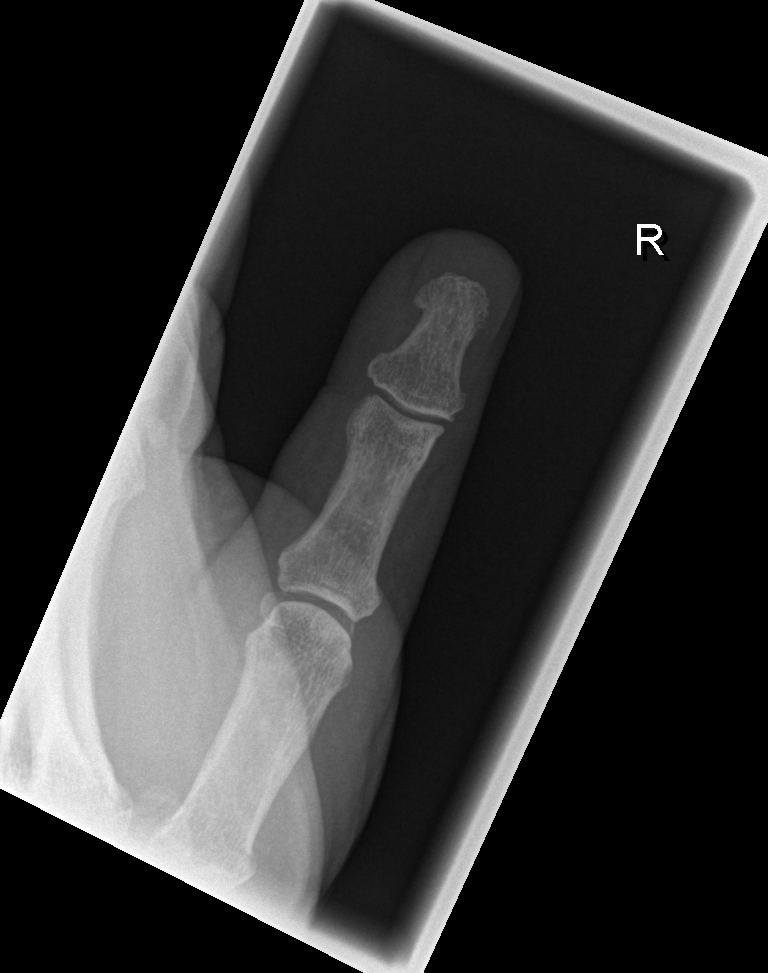

[x finger obl. right]
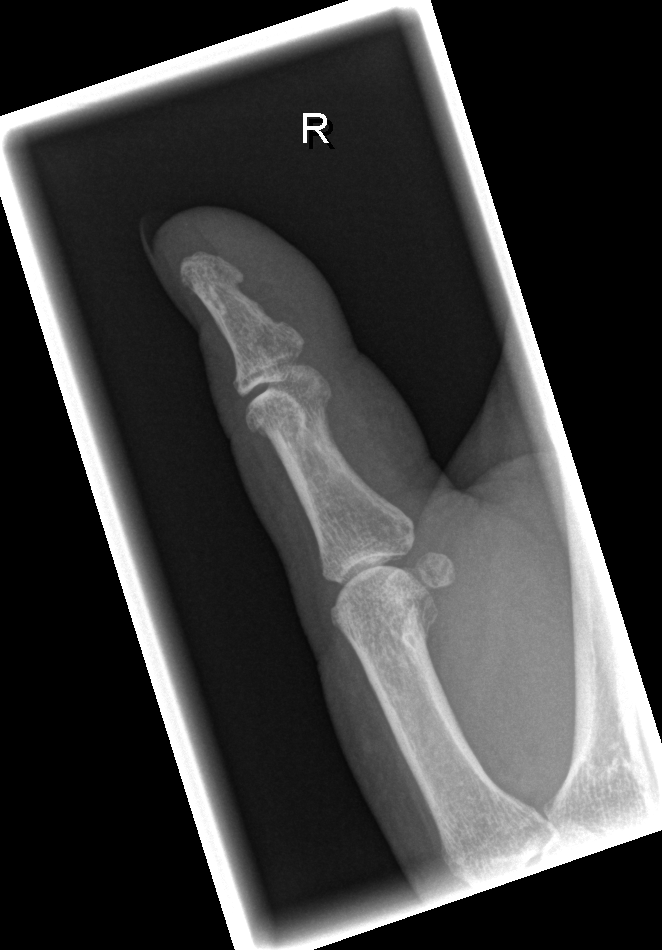

[x finger lateral right]
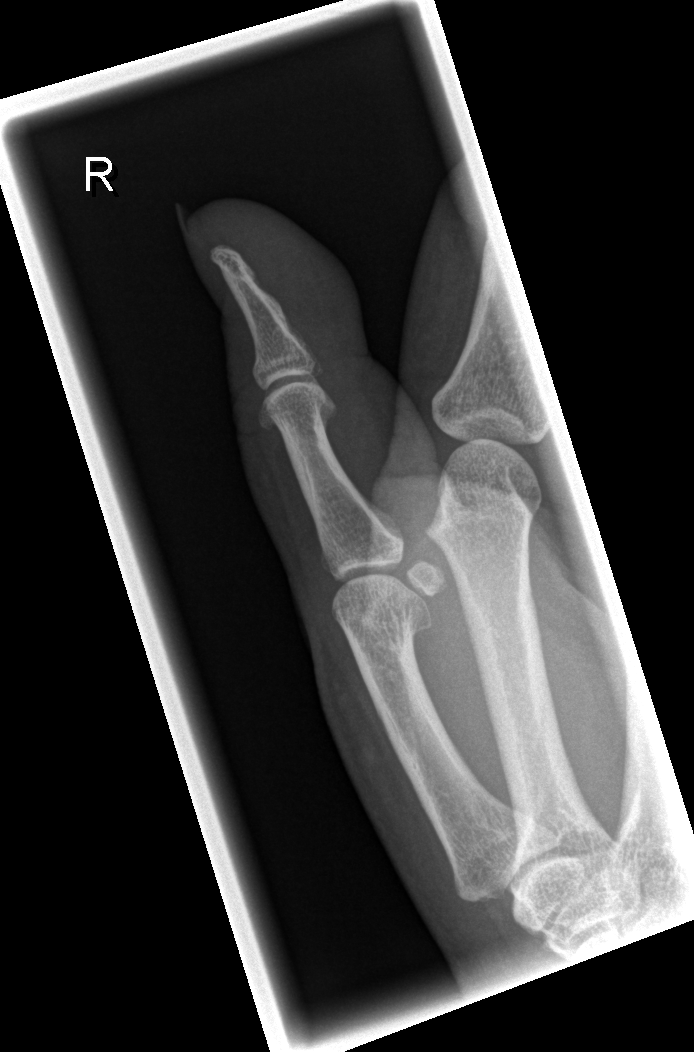

[3 of 3 positions shown; findings below may reference images not displayed]

FINDINGS: There is no evidence of fracture or dislocation. There is no
evidence of arthropathy or other focal bone abnormality. Soft
tissues are unremarkable
IMPRESSION: No acute abnormality.

## 2016-02-17 ENCOUNTER — Ambulatory Visit (HOSPITAL_COMMUNITY)
Admission: EM | Admit: 2016-02-17 | Discharge: 2016-02-17 | Disposition: A | Discharge status: Home / Self Care | Payer: Self-pay | Attending: Family Medicine | Admitting: Family Medicine

## 2016-02-17 ENCOUNTER — Encounter (HOSPITAL_COMMUNITY): Payer: Self-pay | Admitting: *Deleted

## 2016-02-17 DIAGNOSIS — K0889 Other specified disorders of teeth and supporting structures: Secondary | ICD-10-CM | POA: Insufficient documentation

## 2016-02-17 DIAGNOSIS — F17210 Nicotine dependence, cigarettes, uncomplicated: Secondary | ICD-10-CM | POA: Insufficient documentation

## 2016-02-17 MED ORDER — CLINDAMYCIN HCL 300 MG PO CAPS: 300.0000 mg | ORAL_CAPSULE | Freq: Three times a day (TID) | ORAL | Status: AC

## 2016-02-17 MED ORDER — HYDROCODONE-ACETAMINOPHEN 5-325 MG PO TABS: 1.0000 | ORAL_TABLET | Freq: Four times a day (QID) | ORAL | Status: AC | PRN

## 2016-02-17 NOTE — ED Notes (Signed)
PT  HAS  R  UPPER  TOOTHACHE     X  1.5   WEEKS       -      PT  REPORTS  A   PIECE  OF  FILLING  CAME  OUT      HAS  NOT  SEEN  A  DENTIST         YET    DUE  TO  FINANCES

## 2016-02-17 NOTE — Discharge Instructions (Signed)
See dentist soon , take medicine as prescribed.

## 2016-02-17 NOTE — ED Provider Notes (Signed)
CSN: [id number]     Arrival date & time 02/17/16  1947 History   First MD Initiated Contact with Patient 02/17/16 1956     Chief Complaint  Patient presents with  . Dental Pain   (Consider location/radiation/quality/duration/timing/severity/associated sxs/prior Treatment) Patient is a 55 y.o. female presenting with tooth pain. The history is provided by the patient.  Dental Pain Location:  Upper Upper teeth location:  4/RU 2nd bicuspid Quality:  Aching and throbbing Severity:  Mild Onset quality:  Gradual Duration:  1 week Progression:  Worsening Chronicity:  New Associated symptoms: facial pain   Associated symptoms: no fever and no gum swelling   Risk factors: lack of dental care     History reviewed. No pertinent past medical history. Past Surgical History  Procedure Laterality Date  . Abdominal hysterectomy    . Cholecystectomy     Family History  Problem Relation Age of Onset  . Hypertension Other    Social History  Substance Use Topics  . Smoking status: Current Every Day Smoker -- 0.50 packs/day  . Smokeless tobacco: None  . Alcohol Use: No   OB History    No data available     Review of Systems  Constitutional: Negative.  Negative for fever.  HENT: Positive for dental problem.   All other systems reviewed and are negative.   Allergies  Review of patient's allergies indicates no known allergies.  Home Medications   Prior to Admission medications   Medication Sig Start Date End Date Taking? Authorizing Provider  clindamycin (CLEOCIN) 300 MG capsule Take 1 capsule (300 mg total) by mouth 3 (three) times daily. 02/17/16   Linna Hoff, MD  doxycycline (VIBRA-TABS) 100 MG tablet Take 1 tablet (100 mg total) by mouth 2 (two) times daily. 05/07/14   Ozella Rocks, MD  fluconazole (DIFLUCAN) 150 MG tablet Take 1 tablet (150 mg total) by mouth daily. Repeat dose in 3 days 05/07/14   Ozella Rocks, MD  HYDROcodone-acetaminophen (NORCO/VICODIN) 5-325 MG  tablet Take 1 tablet by mouth every 6 (six) hours as needed. 02/17/16   Linna Hoff, MD  indomethacin (INDOCIN) 25 MG capsule Take 1 capsule (25 mg total) by mouth 3 (three) times daily with meals. May take up to  three times a day if no improvement with . 05/20/14   Hannah Muthersbaugh, PA-C  indomethacin (INDOCIN) 50 MG capsule Take 1 capsule (50 mg total) by mouth 3 (three) times daily with meals. 05/07/14   Ozella Rocks, MD  oxyCODONE-acetaminophen (PERCOCET/ROXICET) 5-325 MG per tablet Take 1 tablet by mouth every 6 (six) hours as needed for severe pain. 04/11/14   Benjiman Core, MD   Meds Ordered and Administered this Visit  Medications - No data to display  BP 129/68 mmHg  Pulse 97  Temp(Src) 98.3 F (36.8 C) (Oral)  Resp 16  SpO2 98% No data found.   Physical Exam  Constitutional: She is oriented to person, place, and time. She appears well-developed and well-nourished. No distress.  HENT:  Mouth/Throat: Mucous membranes are normal. Abnormal dentition. Dental abscesses present. No oropharyngeal exudate.    Neck: Normal range of motion. Neck supple.  Lymphadenopathy:    She has no cervical adenopathy.  Neurological: She is alert and oriented to person, place, and time.  Skin: Skin is warm and dry.  Nursing note and vitals reviewed.   ED Course  Procedures (including critical care time)  Labs Review Labs Reviewed - No data to display  Imaging  Review No results found.   Visual Acuity Review  Right Eye Distance:   Left Eye Distance:   Bilateral Distance:    Right Eye Near:   Left Eye Near:    Bilateral Near:         MDM   1. Pain, dental        Linna HoffJames D Ilse Billman, MD 02/18/16 2057

## 2016-02-19 ENCOUNTER — Emergency Department (HOSPITAL_COMMUNITY)
Admission: EM | Admit: 2016-02-19 | Discharge: 2016-02-19 | Disposition: A | Discharge status: Home / Self Care | Payer: Self-pay | Attending: Dermatology | Admitting: Dermatology

## 2016-02-19 DIAGNOSIS — Z5321 Procedure and treatment not carried out due to patient leaving prior to being seen by health care provider: Secondary | ICD-10-CM | POA: Insufficient documentation

## 2016-02-19 LAB — CBC WITH DIFFERENTIAL/PLATELET
Basophils Absolute: 0.1 10*3/uL (ref 0.0–0.1)
Basophils Relative: 1 %
Eosinophils Absolute: 0.3 10*3/uL (ref 0.0–0.7)
Eosinophils Relative: 6 %
HEMATOCRIT: 45.8 % (ref 36.0–46.0)
Hemoglobin: 15.3 g/dL — ABNORMAL HIGH (ref 12.0–15.0)
LYMPHS ABS: 2.5 10*3/uL (ref 0.7–4.0)
LYMPHS PCT: 41 %
MCH: 32.7 pg (ref 26.0–34.0)
MCHC: 33.4 g/dL (ref 30.0–36.0)
MCV: 97.9 fL (ref 78.0–100.0)
MONO ABS: 0.4 10*3/uL (ref 0.1–1.0)
MONOS PCT: 7 %
NEUTROS ABS: 2.8 10*3/uL (ref 1.7–7.7)
Neutrophils Relative %: 45 %
Platelets: 201 10*3/uL (ref 150–400)
RBC: 4.68 MIL/uL (ref 3.87–5.11)
RDW: 13.3 % (ref 11.5–15.5)
WBC: 6.1 10*3/uL (ref 4.0–10.5)

## 2016-02-19 LAB — COMPREHENSIVE METABOLIC PANEL
ALT: 34 U/L (ref 14–54)
AST: 37 U/L (ref 15–41)
Albumin: 4.3 g/dL (ref 3.5–5.0)
Alkaline Phosphatase: 67 U/L (ref 38–126)
Anion gap: 6 (ref 5–15)
BILIRUBIN TOTAL: 0.6 mg/dL (ref 0.3–1.2)
BUN: 14 mg/dL (ref 6–20)
CHLORIDE: 107 mmol/L (ref 101–111)
CO2: 25 mmol/L (ref 22–32)
CREATININE: 0.92 mg/dL (ref 0.44–1.00)
Calcium: 9.9 mg/dL (ref 8.9–10.3)
GFR calc non Af Amer: >60 mL/min (ref >60)
Glucose, Bld: 94 mg/dL (ref 65–99)
POTASSIUM: 4.2 mmol/L (ref 3.5–5.1)
Sodium: 138 mmol/L (ref 135–145)
Total Protein: 7.8 g/dL (ref 6.5–8.1)
